# Patient Record
Sex: Female | Born: 1970 | Hispanic: Yes | Marital: Married | Smoking: Never smoker
Health system: Southern US, Community
[De-identification: ages and names within clinical notes are randomized; demographics above are authoritative.]

## PROBLEM LIST (undated history)

## (undated) DIAGNOSIS — E119 Type 2 diabetes mellitus without complications: Secondary | ICD-10-CM

## (undated) DIAGNOSIS — I10 Essential (primary) hypertension: Secondary | ICD-10-CM

---

## 2000-11-11 ENCOUNTER — Encounter: Admission: RE | Admit: 2000-11-11 | Discharge: 2000-11-11 | Payer: Self-pay

## 2003-09-23 ENCOUNTER — Encounter: Admission: RE | Admit: 2003-09-23 | Discharge: 2003-12-22 | Payer: Self-pay | Admitting: Obstetrics and Gynecology

## 2004-02-15 ENCOUNTER — Inpatient Hospital Stay (HOSPITAL_COMMUNITY): Admission: RE | Admit: 2004-02-15 | Discharge: 2004-02-20 | Payer: Self-pay | Admitting: Obstetrics & Gynecology

## 2004-02-22 ENCOUNTER — Inpatient Hospital Stay (HOSPITAL_COMMUNITY): Admission: AD | Admit: 2004-02-22 | Discharge: 2004-02-22 | Payer: Self-pay | Admitting: *Deleted

## 2004-02-23 ENCOUNTER — Inpatient Hospital Stay (HOSPITAL_COMMUNITY): Admission: AD | Admit: 2004-02-23 | Discharge: 2004-02-23 | Payer: Self-pay | Admitting: Obstetrics and Gynecology

## 2004-03-08 ENCOUNTER — Encounter: Admission: RE | Admit: 2004-03-08 | Discharge: 2004-03-08 | Payer: Self-pay | Admitting: Family Medicine

## 2004-03-22 ENCOUNTER — Encounter: Admission: RE | Admit: 2004-03-22 | Discharge: 2004-03-22 | Payer: Self-pay | Admitting: Family Medicine

## 2004-04-18 ENCOUNTER — Encounter: Admission: RE | Admit: 2004-04-18 | Discharge: 2004-04-18 | Payer: Self-pay | Admitting: Family Medicine

## 2004-05-16 ENCOUNTER — Encounter: Admission: RE | Admit: 2004-05-16 | Discharge: 2004-05-16 | Payer: Self-pay | Admitting: Sports Medicine

## 2004-05-23 ENCOUNTER — Encounter: Admission: RE | Admit: 2004-05-23 | Discharge: 2004-05-23 | Payer: Self-pay | Admitting: Family Medicine

## 2004-07-04 ENCOUNTER — Ambulatory Visit: Payer: Self-pay | Admitting: Sports Medicine

## 2004-08-01 ENCOUNTER — Ambulatory Visit: Payer: Self-pay | Admitting: Sports Medicine

## 2004-08-29 ENCOUNTER — Ambulatory Visit: Payer: Self-pay | Admitting: Sports Medicine

## 2004-11-28 ENCOUNTER — Ambulatory Visit: Payer: Self-pay | Admitting: Sports Medicine

## 2004-12-26 ENCOUNTER — Ambulatory Visit: Payer: Self-pay | Admitting: Family Medicine

## 2005-02-13 ENCOUNTER — Ambulatory Visit: Payer: Self-pay | Admitting: Sports Medicine

## 2005-03-16 ENCOUNTER — Ambulatory Visit: Payer: Self-pay | Admitting: Family Medicine

## 2005-06-12 ENCOUNTER — Ambulatory Visit: Payer: Self-pay | Admitting: Sports Medicine

## 2005-06-12 ENCOUNTER — Encounter (INDEPENDENT_AMBULATORY_CARE_PROVIDER_SITE_OTHER): Payer: Self-pay | Admitting: Sports Medicine

## 2005-09-25 ENCOUNTER — Ambulatory Visit: Payer: Self-pay

## 2005-11-06 ENCOUNTER — Ambulatory Visit: Payer: Self-pay | Admitting: Sports Medicine

## 2005-11-23 ENCOUNTER — Ambulatory Visit (HOSPITAL_COMMUNITY): Admission: RE | Admit: 2005-11-23 | Discharge: 2005-11-23 | Payer: Self-pay | Admitting: Sports Medicine

## 2005-12-21 ENCOUNTER — Ambulatory Visit: Payer: Self-pay | Admitting: Sports Medicine

## 2005-12-28 ENCOUNTER — Ambulatory Visit: Payer: Self-pay | Admitting: Family Medicine

## 2006-01-01 ENCOUNTER — Ambulatory Visit: Payer: Self-pay | Admitting: Sports Medicine

## 2006-01-15 ENCOUNTER — Ambulatory Visit: Payer: Self-pay | Admitting: Family Medicine

## 2006-04-25 ENCOUNTER — Ambulatory Visit: Payer: Self-pay | Admitting: Sports Medicine

## 2006-08-08 ENCOUNTER — Encounter (INDEPENDENT_AMBULATORY_CARE_PROVIDER_SITE_OTHER): Payer: Self-pay | Admitting: *Deleted

## 2006-08-20 ENCOUNTER — Ambulatory Visit: Payer: Self-pay | Admitting: Family Medicine

## 2006-09-10 ENCOUNTER — Ambulatory Visit: Payer: Self-pay | Admitting: Sports Medicine

## 2006-11-12 ENCOUNTER — Ambulatory Visit: Payer: Self-pay | Admitting: Family Medicine

## 2006-12-05 DIAGNOSIS — E1169 Type 2 diabetes mellitus with other specified complication: Secondary | ICD-10-CM | POA: Insufficient documentation

## 2006-12-05 DIAGNOSIS — E669 Obesity, unspecified: Secondary | ICD-10-CM

## 2006-12-05 DIAGNOSIS — E1040 Type 1 diabetes mellitus with diabetic neuropathy, unspecified: Secondary | ICD-10-CM | POA: Insufficient documentation

## 2006-12-05 DIAGNOSIS — I10 Essential (primary) hypertension: Secondary | ICD-10-CM

## 2006-12-05 DIAGNOSIS — E1165 Type 2 diabetes mellitus with hyperglycemia: Secondary | ICD-10-CM

## 2006-12-06 ENCOUNTER — Encounter (INDEPENDENT_AMBULATORY_CARE_PROVIDER_SITE_OTHER): Payer: Self-pay | Admitting: *Deleted

## 2007-03-18 ENCOUNTER — Ambulatory Visit: Payer: Self-pay | Admitting: Sports Medicine

## 2007-07-01 ENCOUNTER — Encounter: Payer: Self-pay | Admitting: Family Medicine

## 2007-07-01 ENCOUNTER — Ambulatory Visit: Payer: Self-pay | Admitting: Family Medicine

## 2007-07-01 LAB — CONVERTED CEMR LAB
BUN: 13 mg/dL (ref 6–23)
CO2: 22 meq/L (ref 19–32)
Chloride: 101 meq/L (ref 96–112)
Creatinine, Ser: 0.54 mg/dL (ref 0.40–1.20)
Glucose, Bld: 213 mg/dL — ABNORMAL HIGH (ref 70–99)
HDL: 38 mg/dL — ABNORMAL LOW (ref >39)
Sodium: 137 meq/L (ref 135–145)
Total CHOL/HDL Ratio: 4.3
Triglycerides: 221 mg/dL — ABNORMAL HIGH (ref <150)
VLDL: 44 mg/dL — ABNORMAL HIGH (ref 0–40)

## 2007-07-02 ENCOUNTER — Encounter: Payer: Self-pay | Admitting: Family Medicine

## 2007-07-07 ENCOUNTER — Ambulatory Visit: Payer: Self-pay | Admitting: Family Medicine

## 2007-07-08 ENCOUNTER — Encounter: Payer: Self-pay | Admitting: Family Medicine

## 2007-08-26 ENCOUNTER — Ambulatory Visit: Payer: Self-pay | Admitting: Family Medicine

## 2007-12-17 ENCOUNTER — Ambulatory Visit: Payer: Self-pay | Admitting: Family Medicine

## 2007-12-17 LAB — CONVERTED CEMR LAB
BUN: 12 mg/dL (ref 6–23)
CO2: 22 meq/L (ref 19–32)
Chloride: 100 meq/L (ref 96–112)
Glucose, Bld: 296 mg/dL — ABNORMAL HIGH (ref 70–99)
Potassium: 3.8 meq/L (ref 3.5–5.3)
Sodium: 135 meq/L (ref 135–145)

## 2007-12-19 ENCOUNTER — Encounter: Payer: Self-pay | Admitting: Family Medicine

## 2007-12-19 ENCOUNTER — Telehealth: Payer: Self-pay | Admitting: *Deleted

## 2007-12-22 ENCOUNTER — Encounter: Payer: Self-pay | Admitting: Family Medicine

## 2007-12-29 ENCOUNTER — Ambulatory Visit: Payer: Self-pay | Admitting: Family Medicine

## 2008-01-20 ENCOUNTER — Ambulatory Visit: Payer: Self-pay | Admitting: Family Medicine

## 2008-03-24 ENCOUNTER — Encounter: Payer: Self-pay | Admitting: Family Medicine

## 2008-03-24 ENCOUNTER — Ambulatory Visit: Payer: Self-pay | Admitting: Family Medicine

## 2008-03-24 LAB — CONVERTED CEMR LAB
AST: 19 units/L (ref 0–37)
Albumin: 4.2 g/dL (ref 3.5–5.2)
Creatinine, Ser: 0.53 mg/dL (ref 0.40–1.20)
Sodium: 138 meq/L (ref 135–145)
Total Bilirubin: 0.5 mg/dL (ref 0.3–1.2)
Total Protein: 7.9 g/dL (ref 6.0–8.3)

## 2008-03-25 ENCOUNTER — Encounter: Payer: Self-pay | Admitting: Family Medicine

## 2008-04-05 ENCOUNTER — Encounter: Payer: Self-pay | Admitting: Family Medicine

## 2008-07-07 ENCOUNTER — Ambulatory Visit: Payer: Self-pay | Admitting: Family Medicine

## 2008-07-09 ENCOUNTER — Encounter: Payer: Self-pay | Admitting: Family Medicine

## 2008-10-05 ENCOUNTER — Encounter: Payer: Self-pay | Admitting: Family Medicine

## 2008-11-24 ENCOUNTER — Ambulatory Visit: Payer: Self-pay | Admitting: Family Medicine

## 2008-12-09 ENCOUNTER — Ambulatory Visit: Payer: Self-pay | Admitting: Family Medicine

## 2008-12-23 ENCOUNTER — Ambulatory Visit: Payer: Self-pay | Admitting: Family Medicine

## 2008-12-23 ENCOUNTER — Encounter: Payer: Self-pay | Admitting: Pharmacist

## 2008-12-27 ENCOUNTER — Ambulatory Visit: Payer: Self-pay | Admitting: Family Medicine

## 2008-12-27 ENCOUNTER — Encounter (INDEPENDENT_AMBULATORY_CARE_PROVIDER_SITE_OTHER): Payer: Self-pay | Admitting: Family Medicine

## 2009-01-13 ENCOUNTER — Ambulatory Visit: Payer: Self-pay | Admitting: Family Medicine

## 2009-02-10 ENCOUNTER — Encounter (INDEPENDENT_AMBULATORY_CARE_PROVIDER_SITE_OTHER): Payer: Self-pay | Admitting: Family Medicine

## 2009-02-10 ENCOUNTER — Ambulatory Visit: Payer: Self-pay | Admitting: Family Medicine

## 2009-02-10 ENCOUNTER — Encounter: Payer: Self-pay | Admitting: Family Medicine

## 2009-02-17 ENCOUNTER — Ambulatory Visit: Payer: Self-pay | Admitting: Family Medicine

## 2009-02-22 ENCOUNTER — Ambulatory Visit: Payer: Self-pay | Admitting: Family Medicine

## 2009-02-24 ENCOUNTER — Telehealth: Payer: Self-pay | Admitting: *Deleted

## 2009-03-16 ENCOUNTER — Encounter: Payer: Self-pay | Admitting: Family Medicine

## 2009-03-16 ENCOUNTER — Ambulatory Visit: Payer: Self-pay | Admitting: Family Medicine

## 2009-03-16 LAB — CONVERTED CEMR LAB: GC Probe Amp, Urine: NEGATIVE

## 2009-03-18 ENCOUNTER — Encounter: Payer: Self-pay | Admitting: Family Medicine

## 2009-03-24 ENCOUNTER — Encounter: Payer: Self-pay | Admitting: Family Medicine

## 2009-05-25 ENCOUNTER — Telehealth: Payer: Self-pay | Admitting: Family Medicine

## 2009-05-26 ENCOUNTER — Ambulatory Visit: Payer: Self-pay | Admitting: Family Medicine

## 2009-05-26 LAB — CONVERTED CEMR LAB
Albumin: 3.9 g/dL (ref 3.5–5.2)
Alkaline Phosphatase: 65 units/L (ref 39–117)
CO2: 21 meq/L (ref 19–32)
Chloride: 104 meq/L (ref 96–112)
Creatinine, Ser: 0.53 mg/dL (ref 0.40–1.20)
Glucose, Bld: 166 mg/dL — ABNORMAL HIGH (ref 70–99)
Potassium: 4.1 meq/L (ref 3.5–5.3)
Total Protein: 7.2 g/dL (ref 6.0–8.3)

## 2009-09-07 ENCOUNTER — Ambulatory Visit: Payer: Self-pay | Admitting: Family Medicine

## 2009-09-07 LAB — CONVERTED CEMR LAB
BUN: 11 mg/dL (ref 6–23)
Calcium: 8.9 mg/dL (ref 8.4–10.5)
Chloride: 106 meq/L (ref 96–112)
Glucose, Bld: 181 mg/dL — ABNORMAL HIGH (ref 70–99)
Hgb A1c MFr Bld: 9.2 %
Potassium: 4.2 meq/L (ref 3.5–5.3)
Sodium: 138 meq/L (ref 135–145)

## 2009-12-28 ENCOUNTER — Ambulatory Visit: Payer: Self-pay | Admitting: Family Medicine

## 2009-12-28 LAB — CONVERTED CEMR LAB: Hgb A1c MFr Bld: 9.1 %

## 2010-01-05 ENCOUNTER — Ambulatory Visit: Payer: Self-pay | Admitting: Family Medicine

## 2010-01-11 ENCOUNTER — Encounter (INDEPENDENT_AMBULATORY_CARE_PROVIDER_SITE_OTHER): Payer: Self-pay | Admitting: *Deleted

## 2010-01-11 ENCOUNTER — Telehealth (INDEPENDENT_AMBULATORY_CARE_PROVIDER_SITE_OTHER): Payer: Self-pay | Admitting: Family Medicine

## 2010-05-10 ENCOUNTER — Encounter: Payer: Self-pay | Admitting: *Deleted

## 2010-05-10 ENCOUNTER — Ambulatory Visit: Payer: Self-pay | Admitting: Family Medicine

## 2010-05-10 LAB — CONVERTED CEMR LAB
Albumin: 4 g/dL (ref 3.5–5.2)
CO2: 23 meq/L (ref 19–32)
Chloride: 100 meq/L (ref 96–112)
Glucose, Bld: 280 mg/dL — ABNORMAL HIGH (ref 70–99)
Sodium: 134 meq/L — ABNORMAL LOW (ref 135–145)
TSH: 1.16 microintl units/mL (ref 0.350–4.500)
Total Bilirubin: 0.4 mg/dL (ref 0.3–1.2)
Total Protein: 7.6 g/dL (ref 6.0–8.3)

## 2010-05-11 ENCOUNTER — Encounter: Payer: Self-pay | Admitting: Family Medicine

## 2010-05-12 ENCOUNTER — Encounter: Payer: Self-pay | Admitting: Family Medicine

## 2010-07-28 ENCOUNTER — Encounter: Payer: Self-pay | Admitting: Pharmacist

## 2010-08-09 ENCOUNTER — Other Ambulatory Visit: Admission: RE | Admit: 2010-08-09 | Discharge: 2010-08-09 | Payer: Self-pay | Admitting: Family Medicine

## 2010-08-09 ENCOUNTER — Ambulatory Visit: Payer: Self-pay | Admitting: Family Medicine

## 2010-08-09 LAB — CONVERTED CEMR LAB
Hgb A1c MFr Bld: 12.1 %
Pap Smear: NEGATIVE

## 2010-08-15 ENCOUNTER — Encounter: Payer: Self-pay | Admitting: Family Medicine

## 2010-09-20 ENCOUNTER — Ambulatory Visit: Payer: Self-pay | Admitting: Family Medicine

## 2010-11-09 NOTE — Assessment & Plan Note (Signed)
Summary: f/up,tcb   Vital Signs:  Patient profile:   40 year old female Weight:      165.6 pounds Temp:     98.8 degrees F oral Pulse rate:   79 / minute Pulse rhythm:   regular BP sitting:   109 / 76  (left arm) Cuff size:   regular  Vitals Entered By: Loralee Pacas CMA (May 10, 2010 10:42 AM)  Primary Care Provider:  Denny Levy MD   History of Present Illness: Diabetes follow up with blood sugars in   moderately  good control,   no episodes of low blood sugar. Taking medicines regularly and having no problems with them.  f/u toenail removal--it looks like it is growing back again. Not ingrown at this time. Why is it back?  Hair falling out--last 1 m. Just had big stressor in taht her Father died a month ago.   Current Medications (verified): 1)  Aspirin Childrens 81 Mg Chew (Aspirin) .... Take 1 Tablet By Mouth Once A Day 2)  Zestoretic 20-25 Mg  Tabs (Lisinopril-Hydrochlorothiazide) .Marland Kitchen.. 1 By Mouth Once Daily For Blood Pressure 3)  Metformin Hcl 1000 Mg  Tabs (Metformin Hcl) .Marland Kitchen.. 1  Tab Am and 1 Tab Pm 4)  Micro-K 10 Meq  Cpcr (Potassium Chloride) .... 2 By Mouth Once Daily 5)  Fluconazole 200 Mg  Tabs (Fluconazole) .... Sig 1 By Mouth Today and Repeat X1 in 2 Days For Vaginal Itching 6)  Lantus 100 Unit/ml Soln (Insulin Glargine) .... 30 Units Once Daily At Bedtime - Dispense 1 Month Supply  Allergies: No Known Drug Allergies  Review of Systems  The patient denies anorexia, fever, weight gain, chest pain, syncope, dyspnea on exertion, hemoptysis, abdominal pain, melena, and severe indigestion/heartburn.    Physical Exam  General:  alert, well-developed, and well-nourished.   Neck:  supple, full ROM, no masses, no thyromegaly, and no thyroid nodules or tenderness.   Lungs:  normal respiratory effort.   Heart:  normal rate and regular rhythm.   Abdomen:  soft and non-tender.   Extremities:  LEFT Great toenail is re growing--but no sign of infection.   Impression  & Recommendations:  Problem # 1:  DIABETES MELLITUS II, UNCOMPLICATED (ICD-250.00)  Orders: TSH-[facility] (60454-09811) [facility]- Est  Level 4 (91478)  Problem # 2:  HAIR LOSS (ICD-704.00)  Orders: TSH-[facility] ([id number]) [facility]- Est  Level 4 (57846) likely telogen effluvium will check thyroid  Problem # 3:  INGROWN TOENAIL (ICD-703.0) this is second time this nail has failed to be erradicated. will follow for now  Complete Medication List: 1)  Aspirin Childrens 81 Mg Chew (Aspirin) .... Take 1 tablet by mouth once a day 2)  Zestoretic 20-25 Mg Tabs (Lisinopril-hydrochlorothiazide) .Marland Kitchen.. 1 by mouth once daily for blood pressure 3)  Metformin Hcl 1000 Mg Tabs (Metformin hcl) .Marland Kitchen.. 1  tab am and 1 tab pm 4)  Micro-k 10 Meq Cpcr (Potassium chloride) .... 2 by mouth once daily 5)  Fluconazole 200 Mg Tabs (Fluconazole) .... Sig 1 by mouth today and repeat x1 in 2 days for vaginal itching 6)  Lantus 100 Unit/ml Soln (Insulin glargine) .... 30 units once daily at bedtime - dispense 1 month supply  Other Orders: Comp Met-[facility] ([id number]) A1C-[facility] (24401)  Prevention & Chronic Care Immunizations   Influenza vaccine: Fluvax Non-MCR  (09/07/2009)   Influenza vaccine due: 08/25/2008    Tetanus booster: 02/05/2005: Done.   Tetanus booster due: 02/06/2015    Pneumococcal vaccine: Not documented  Other Screening   Pap smear: NEGATIVE FOR INTRAEPITHELIAL LESIONS OR MALIGNANCY.  (03/16/2009)   Pap smear due: 03/16/2010    Mammogram: Not documented   Smoking status: never  (01/05/2010)  Diabetes Mellitus   HgbA1C: 11.4  (05/10/2010)   HgbA1C action/deferral: Ordered  (09/07/2009)   Hemoglobin A1C due: 12/06/2009    Eye exam: Not documented    Foot exam: yes  (07/07/2007)   High risk foot: Not documented   Foot care education: Not documented   Foot exam due: 07/06/2008    Urine microalbumin/creatinine ratio: Not documented   Urine microalbumin/cr due: 12/16/2008    Diabetes flowsheet  reviewed?: Yes   Progress toward A1C goal: Unchanged  Lipids   Total Cholesterol: 162  (07/01/2007)   Lipid panel action/deferral: LDL Direct Ordered   LDL: 80  (07/01/2007)   LDL Direct: 95  (09/07/2009)   HDL: 38  (07/01/2007)   Triglycerides: 221  (07/01/2007)  Hypertension   Last Blood Pressure: 109 / 76  (05/10/2010)   Serum creatinine: 0.51  (09/07/2009)   BMP action: Ordered   Serum potassium 4.2  (09/07/2009) CMP ordered   Self-Management Support :   Personal Goals (by the next clinic visit) :      Personal blood pressure goal: 130/80  (09/07/2009)     Personal LDL goal: 100  (09/07/2009)    Diabetes self-management support: Pre-printed educational material  (09/07/2009)    Hypertension self-management support: Not documented    Hypertension self-management support not done because: Good outcomes  (09/07/2009)  Laboratory Results   Blood Tests   Date/Time Received: May 10, 2010 11:22 AM  Date/Time Reported: May 10, 2010 11:40 AM   HGBA1C: 11.4%   (Normal Range: Non-Diabetic - 3-6%   Control Diabetic - 6-8%)  Comments: ...........test performed by...........Marland KitchenTerese Door, CMA

## 2010-11-09 NOTE — Letter (Signed)
Summary: Out of Work  [facility]  [address]   [place], [place] [address]   Phone: [phone]  Fax: [phone]    January 11, 2010   Employee:  Aura Dials    To Whom It May Concern:   For Medical reasons, please excuse the above named employee from work for the following dates:  Start:   January 13, 2010  End:   January 17, 2010  If you need additional information, please feel free to contact our office.         Sincerely,    Gladstone Pih

## 2010-11-09 NOTE — Letter (Signed)
Summary: Out of Work  [facility]  [address]   [place], [place] [address]   Phone: [phone]  Fax: [phone]    January 05, 2010   Employee:  Aura Dials    To Whom It May Concern:   For Medical reasons, please excuse the above named employee from work for the following dates:  Start:   01/05/2010  End:   01/13/2010  If you need additional information, please feel free to contact our office.         Sincerely,    Denny Levy MD

## 2010-11-09 NOTE — Letter (Signed)
Summary: Work Excuse  [facility]  [address]   [place], [place] [address]   Phone: [phone]  Fax: [phone]    Today's Date: May 10, 2010  Name of Patient: Rebecca Terrell  The above named patient had a medical visit today at: 10 am.  Please take this into consideration when reviewing the time away from work/school.    Special Instructions:  [ x None  [  ] To be off the remainder of today, returning to the normal work / school schedule tomorrow.  [  ] To be off until the next scheduled appointment on ______________________.  [  ] Other ________________________________________________________________ ________________________________________________________________________   Sincerely yours,   Loralee Pacas CMA

## 2010-11-09 NOTE — Letter (Signed)
Summary: Generic Letter  [facility]  [address]   [place], [place] [address]   Phone: [phone]  Fax: [phone]    05/11/2010  [place] [address] [place], [place]  [address]  Dear [name],  Your A1C jumped WAY back up---to 11.4.   You had been doing much better.  Please schedule an appointment in [facility]Y [facility] to see Dr Raymondo Band as soon as you can get in. I think we need to make some adjustments pretty quickly.  I will  still plan on seeing you back in 3 months unless Dr Raymondo Band thinks I need to see you soone. Let me know if you have any problem scheduling to see him.  The rest of your blood work including your thyroid test looked OK.        Sincerely,   Denny Levy MD   Appended Document: Generic Letter mailed

## 2010-11-09 NOTE — Assessment & Plan Note (Signed)
Summary: CPE/KH   Vital Signs:  Patient profile:   40 year old female LMP:     07/29/2010 Weight:      164 pounds Temp:     98.4 degrees F oral Pulse rate:   67 / minute Pulse rhythm:   regular BP sitting:   115 / 83  (right arm) Cuff size:   regular  Vitals Entered By: Loralee Pacas CMA (August 09, 2010 10:26 AM) CC: cpe Is Patient Diabetic? Yes Did you bring your meter with you today? No LMP (date): 07/29/2010     Enter LMP: 07/29/2010 Last PAP Result NEGATIVE FOR INTRAEPITHELIAL LESIONS OR MALIGNANCY.   Primary Care Provider:  Denny Levy MD  CC:  cpe.  History of Present Illness: pap smear and dm f/u. Here with her own interpretor Elvera Maria. 1) dm--has missed her insulin on average 1-2 days a week--no specific reason. Has eough medicine and no problems with it.  Diet is same. No regular exercise except work. No low blood sugars, no increased thirst or increased urination.  2) wants pap smear to update her preventive needs. Periods regular. No problems.  Habits & Providers  Alcohol-Tobacco-Diet     Tobacco Status: never     Passive Smoke Exposure: no  Exercise-Depression-Behavior     Have you felt down or hopeless? yes     Have you felt little pleasure in things? no     Depression Counseling: further diagnostic testing and/or other treatment is indicated     Seat Belt Use: always  Current Medications (verified): 1)  Aspirin Childrens 81 Mg Chew (Aspirin) .... Take 1 Tablet By Mouth Once A Day 2)  Zestoretic 20-25 Mg  Tabs (Lisinopril-Hydrochlorothiazide) .Marland Kitchen.. 1 By Mouth Once Daily For Blood Pressure 3)  Metformin Hcl 1000 Mg  Tabs (Metformin Hcl) .Marland Kitchen.. 1  Tab Am and 1 Tab Pm 4)  Micro-K 10 Meq  Cpcr (Potassium Chloride) .... 2 By Mouth Once Daily 5)  Fluconazole 200 Mg  Tabs (Fluconazole) .... Sig 1 By Mouth Today and Repeat X1 in 2 Days For Vaginal Itching 6)  Lantus 100 Unit/ml Soln (Insulin Glargine) .... 30 Units Once Daily At Bedtime - Dispense 1  Month Supply 7)  Calcium Carbonate 600 Mg Tabs (Calcium Carbonate) .Marland Kitchen.. 1po Qd  Allergies: No Known Drug Allergies  Social History: Risk analyst Use:  always  Review of Systems  The patient denies anorexia, fever, weight loss, weight gain, vision loss, chest pain, syncope, peripheral edema, prolonged cough, headaches, and abdominal pain.    Physical Exam  General:  alert, well-developed, well-nourished, well-hydrated, and overweight-appearing.   Eyes:  vision grossly intact, pupils equal, and pupils round.   Ears:  R ear normal and L ear normal.   Nose:  no external deformity.   Mouth:  pharynx pink and moist.   Neck:  supple, full ROM, and no masses.   Breasts:  skin/areolae normal, no masses, no abnormal thickening, no nipple discharge, no tenderness, and no adenopathy.  she has some changes of fibrocystic disease but no worrisome masses. Lungs:  normal respiratory effort and normal breath sounds.   Heart:  normal rate, regular rhythm, and no murmur.   Abdomen:  soft and non-tender.   Genitalia:  normal introitus, no external lesions, no vaginal discharge, mucosa pink and moist, no vaginal or cervical lesions, no vaginal atrophy, normal uterus size and position, and no adnexal masses or tenderness.   Msk:  normal ROM, no joint tenderness, and no joint  warmth.   Pulses:  2+ B = radial and DP Extremities:  No clubbing, cyanosis, edema, or deformity noted with normal full range of motion of all joints.   Neurologic:  alert & oriented X3, strength normal in all extremities, and gait normal.   Skin:  turgor normal, color normal, no rashes, and no suspicious lesions.   Psych:  Oriented X3, memory intact for recent and remote, and normally interactive.  Baseline affect of slightly flat.   Impression & Recommendations:  Problem # 1:  DIABETES MELLITUS II, UNCOMPLICATED (ICD-250.00)  Her updated medication list for this problem includes:    Aspirin Childrens 81 Mg Chew (Aspirin) .Marland Kitchen...  Take 1 tablet by mouth once a day    Zestoretic 20-25 Mg Tabs (Lisinopril-hydrochlorothiazide) .Marland Kitchen... 1 by mouth once daily for blood pressure    Metformin Hcl 1000 Mg Tabs (Metformin hcl) .Marland Kitchen... 1  tab am and 1 tab pm    Lantus 100 Unit/ml Soln (Insulin glargine) .Marland KitchenMarland KitchenMarland KitchenMarland Kitchen 30 units once daily at bedtime - dispense 1 month supply  Orders: A1C-[facility] (62952) [facility]- Est  Level 4 (84132) Long discussion, reviewed her A1C readings for last years. Discussed risks of her current lack of glucose control. > 50% ov 40 minutes spent in counseling. Urged to take insulin regularly, discussed diet rtc 3 m  Problem # 2:  PREVENTIVE HEALTH CARE (ICD-V70.0) pap and breast exam today  Complete Medication List: 1)  Aspirin Childrens 81 Mg Chew (Aspirin) .... Take 1 tablet by mouth once a day 2)  Zestoretic 20-25 Mg Tabs (Lisinopril-hydrochlorothiazide) .Marland Kitchen.. 1 by mouth once daily for blood pressure 3)  Metformin Hcl 1000 Mg Tabs (Metformin hcl) .Marland Kitchen.. 1  tab am and 1 tab pm 4)  Micro-k 10 Meq Cpcr (Potassium chloride) .... 2 by mouth once daily 5)  Fluconazole 200 Mg Tabs (Fluconazole) .... Sig 1 by mouth today and repeat x1 in 2 days for vaginal itching 6)  Lantus 100 Unit/ml Soln (Insulin glargine) .... 30 units once daily at bedtime - dispense 1 month supply 7)  Calcium Carbonate 600 Mg Tabs (Calcium carbonate) .Marland Kitchen.. 1po qd  Other Orders: Influenza Vaccine NON MCR (44010) Pap Smear-[facility] ([id number])   Orders Added: 1)  A1C-[facility] [83036] 2)  Influenza Vaccine NON MCR [00028] 3)  Pap Smear-[facility] [[id number]] 4)  [facility]- Est  Level 4 [87564]   Immunizations Administered:  Influenza Vaccine # 1:    Vaccine Type: Fluvax Non-MCR    Site: left deltoid    Mfr: GlaxoSmithKline    Dose: 0.5 ml    Route: IM    Given by: Loralee Pacas CMA    Exp. Date: 04/04/2011    Lot #: [id number]    VIS given: 05/02/10 version given August 09, 2010.  Flu Vaccine Consent Questions:    Do you have a history of severe allergic  reactions to this vaccine? no    Any prior history of allergic reactions to egg and/or gelatin? no    Do you have a sensitivity to the preservative Thimersol? no    Do you have a past history of Guillan-Barre Syndrome? no    Do you currently have an acute febrile illness? no    Have you ever had a severe reaction to latex? no    Vaccine information given and explained to patient? yes    Are you currently pregnant? no   Immunizations Administered:  Influenza Vaccine # 1:    Vaccine Type: Fluvax Non-MCR    Site: left deltoid  Mfr: GlaxoSmithKline    Dose: 0.5 ml    Route: IM    Given by: Loralee Pacas CMA    Exp. Date: 04/04/2011    Lot #: [id number]    VIS given: 05/02/10 version given August 09, 2010.  Laboratory Results   Blood Tests   Date/Time Received: August 09, 2010 10:35 AM  Date/Time Reported: August 09, 2010 11:14 AM   HGBA1C: 12.1%   (Normal Range: Non-Diabetic - 3-6%   Control Diabetic - 6-8%)  Comments: ...............test performed by......Marland KitchenBonnie A. [place], MLS (ASCP)cm

## 2010-11-09 NOTE — Assessment & Plan Note (Signed)
Summary: toenail removal,df   Vital Signs:  Patient profile:   40 year old female Height:      62 inches Weight:      171.0 pounds BMI:     31.39 Temp:     98.4 degrees F oral Pulse rate:   81 / minute BP sitting:   145 / 94  (left arm) Cuff size:   regular  Vitals Entered By: Gladstone Pih (January 05, 2010 10:44 AM) CC: toe nail removal Is Patient Diabetic? Yes Did you bring your meter with you today? No Pain Assessment Patient in pain? no        Primary Care Provider:  Denny Levy MD  CC:  toe nail removal.  History of Present Illness: f/u infected ingrown  great toenail completed antibiotics  Habits & Providers  Alcohol-Tobacco-Diet     Tobacco Status: never  Allergies: No Known Drug Allergies  Physical Exam  Msk:  left great toe no sign infection. medial border nail ingrowth Additional Exam:  Informed consent given and signed copy in chart. Appropriate time out taken. Area prepperd and draped in usual sterile fashion. Rubber band used as tourniquet and --6-- cc of l2% idocaine used in digital block for local anasthesia. Toenail elevated with nail elevator, removed by traction in entirety.The nail bed curetted and phenol application followed by copious alcohol wash  completed. Tourniquet removed. Hemostasis maintained well. topical antibiotic and bulky dresing applied.       Impression & Recommendations:  Problem # 1:  INGROWN NAIL, INFECTED (ICD-703.0)  Her updated medication list for this problem includes:    Keflex 500 Mg Caps (Cephalexin) .Marland Kitchen... 1 by mouth bid  Orders: Provider Misc Charge- [facility] (Misc)  Complete Medication List: 1)  Aspirin Childrens 81 Mg Chew (Aspirin) .... Take 1 tablet by mouth once a day 2)  Zestoretic 20-25 Mg Tabs (Lisinopril-hydrochlorothiazide) .Marland Kitchen.. 1 by mouth once daily for blood pressure 3)  Metformin Hcl 1000 Mg Tabs (Metformin hcl) .Marland Kitchen.. 1  tab am and 1 tab pm 4)  Micro-k 10 Meq Cpcr (Potassium chloride) .... 2 by mouth  once daily 5)  Fluconazole 200 Mg Tabs (Fluconazole) .... Sig 1 by mouth today and repeat x1 in 2 days for vaginal itching 6)  Lantus 100 Unit/ml Soln (Insulin glargine) .... 30 units once daily at bedtime - dispense 1 month supply 7)  Hydrocodone-acetaminophen 5-500 Mg Tabs (Hydrocodone-acetaminophen) .Marland Kitchen.. 1 tab by mouth q 4-6 hrs as needed pain 8)  Keflex 500 Mg Caps (Cephalexin) .Marland Kitchen.. 1 by mouth bid Prescriptions: HYDROCODONE-ACETAMINOPHEN 5-500 MG TABS (HYDROCODONE-ACETAMINOPHEN) 1 tab by mouth q 4-6 hrs as needed pain  #20 x 0   Entered and Authorized by:   Denny Levy MD   Signed by:   Denny Levy MD on 01/05/2010   Method used:   Print then Give to Patient   RxID:   [id number]

## 2010-11-09 NOTE — Letter (Signed)
Summary: PAP Letter  [facility]  [address]   [place], [place] [address]   Phone: [phone]  Fax: [phone]    08/15/2010  Rebecca Terrell [address] [place], [place]  [address]  Dear [name],     Your pap smear showed no sign of cervical cancer. Your next pap should be in 3 years.       Sincerely,   Denny Levy MD  Appended Document: PAP Letter mailed

## 2010-11-09 NOTE — Miscellaneous (Signed)
Summary: Orders Update  Clinical Lists Changes  Problems: Added new problem of ENCOUNTER FOR LONG-TERM USE OF OTHER MEDICATIONS (ICD-V58.69) Orders: Added new Test order of B12-[facility] [phone]) - Signed Added new Test order of CBC-[facility] (75643) - Signed  Per Dr. Jennette Kettle

## 2010-11-09 NOTE — Miscellaneous (Signed)
  Clinical Lists Changes  Medications: Added new medication of CALCIUM CARBONATE 600 MG TABS (CALCIUM CARBONATE) 1po qd - Signed Rx of CALCIUM CARBONATE 600 MG TABS (CALCIUM CARBONATE) 1po qd;  #90 x 3;  Signed;  Entered by: Denny Levy MD;  Authorized by: Denny Levy MD;  Method used: Printed then faxed to    Prescriptions: CALCIUM CARBONATE 600 MG TABS (CALCIUM CARBONATE) 1po qd  #90 x 3   Entered and Authorized by:   Denny Levy MD   Signed by:   Denny Levy MD on 05/12/2010   Method used:   Printed then faxed to ...         RxID:   [id number]  faxed to [facility]

## 2010-11-09 NOTE — Progress Notes (Signed)
Summary: Letter Needed  Phone Note Call from Patient Call back at [phone]   Caller: Delia-Friend Summary of Call: Pt needs another note to stay out of work for another week.  Dr. Jennette Kettle told her to call if she needed one. Fax it to Att: Channing Mutters [phone]. Initial call taken by: Clydell Hakim,  January 11, 2010 12:24 PM  Follow-up for Phone Call        letter faxed Follow-up by: Gladstone Pih,  January 11, 2010 3:52 PM

## 2010-11-09 NOTE — Letter (Signed)
Summary: Out of Work  [facility]  [address]   [place], [place] [address]   Phone: [phone]  Fax: [phone]    September 20, 2010   Employee:  Rebecca Terrell    To Whom It May Concern:   For Medical reasons, please excuse the above named employee from work for the following dates:  Late for work today due to AM doctor's appointment.    If you need additional information, please feel free to contact our office.         Sincerely,    Denny Levy MD

## 2010-11-09 NOTE — Assessment & Plan Note (Signed)
Summary: f/up,tcb   Vital Signs:  Patient profile:   40 year old female Height:      62 inches Weight:      173.2 pounds BMI:     31.79 Temp:     98.5 degrees F oral Pulse rate:   76 / minute BP sitting:   134 / 88  (left arm) Cuff size:   regular  Vitals Entered By: Gladstone Pih (December 28, 2009 11:06 AM) CC: F/U Is Patient Diabetic? Yes Did you bring your meter with you today? No Pain Assessment Patient in pain? no      Comments Left big toe nail grew part and causing her problems   Primary Care Provider:  Denny Levy MD  CC:  F/U.  History of Present Illness: f/u DM: has really worked hard to watch her diet. Sad to learn her A1C is virtually unchanged. She has continued on 12 u lantus subcutaneously and heer oral meds  also having retrn of left great tioe ingrown nail. Sore and looks infected. had removal over one year ago here at [facility]  Habits & Providers  Alcohol-Tobacco-Diet     Tobacco Status: never  Current Medications (verified): 1)  Aspirin Childrens 81 Mg Chew (Aspirin) .... Take 1 Tablet By Mouth Once A Day 2)  Zestoretic 20-25 Mg  Tabs (Lisinopril-Hydrochlorothiazide) .Marland Kitchen.. 1 By Mouth Once Daily For Blood Pressure 3)  Metformin Hcl 1000 Mg  Tabs (Metformin Hcl) .Marland Kitchen.. 1  Tab Am and 1 Tab Pm 4)  Micro-K 10 Meq  Cpcr (Potassium Chloride) .... 2 By Mouth Once Daily 5)  Fluconazole 200 Mg  Tabs (Fluconazole) .... Sig 1 By Mouth Today and Repeat X1 in 2 Days For Vaginal Itching 6)  Lantus 100 Unit/ml Soln (Insulin Glargine) .... 30 Units Once Daily At Bedtime - Dispense 1 Month Supply 7)  Hydrocodone-Acetaminophen 5-500 Mg Tabs (Hydrocodone-Acetaminophen) .Marland Kitchen.. 1 Tab By Mouth Q 4-6 Hrs As Needed Pain 8)  Keflex 500 Mg Caps (Cephalexin) .Marland Kitchen.. 1 By Mouth Bid  Allergies: No Known Drug Allergies  Review of Systems  The patient denies anorexia, fever, weight loss, weight gain, vision loss, chest pain, syncope, dyspnea on exertion, peripheral edema, headaches, and  abdominal pain.    Physical Exam  General:  alert and well-developed.   Neck:  supple, full ROM, no masses, no thyromegaly, and no carotid bruits.   Lungs:  normal respiratory effort and normal breath sounds.   Heart:  normal rate, regular rhythm, and no murmur.   Extremities:  L great toe has evicence of mild cellulitis at medial border of toenail   Impression & Recommendations:  Problem # 1:  DIABETES MELLITUS II, UNCOMPLICATED (ICD-250.00) Assessment Unchanged Orders: A1C-[facility] (19147) [facility]- Est  Level 4 (99214)we will increase her lantus. I removed the sulfonylura as I do not think it is doing anything and may make her more prone top hypoglycemic episodes. wlill repeat A1C 3 months. I suspect we are not yet at her appropriate dose but she wishes to proceed slowly in her titration up of her insuliin  Problem # 2:  INGROWN NAIL, INFECTED (ICD-703.0)  Her updated medication list for this problem includes:    Keflex 500 Mg Caps (Cephalexin) .Marland Kitchen... 1 by mouth bid reviewed the procedure note--it looks like they did  a nail bed ablation and it looks like they tried to reove whole nail---obviosuy this did not work. Will tx w abx--she will call in 48 hours--if not improving we will schedule repeat removal. this  is first time in a year that she has had problems so she wanted to try conservative (abx) tx first and I agree. She will let me know next week how itis doing and if we need to schedule remova, Orders: [facility]- Est  Level 4 (81191)  Complete Medication List: 1)  Aspirin Childrens 81 Mg Chew (Aspirin) .... Take 1 tablet by mouth once a day 2)  Zestoretic 20-25 Mg Tabs (Lisinopril-hydrochlorothiazide) .Marland Kitchen.. 1 by mouth once daily for blood pressure 3)  Metformin Hcl 1000 Mg Tabs (Metformin hcl) .Marland Kitchen.. 1  tab am and 1 tab pm 4)  Micro-k 10 Meq Cpcr (Potassium chloride) .... 2 by mouth once daily 5)  Fluconazole 200 Mg Tabs (Fluconazole) .... Sig 1 by mouth today and repeat x1 in 2 days for vaginal  itching 6)  Lantus 100 Unit/ml Soln (Insulin glargine) .... 30 units once daily at bedtime - dispense 1 month supply 7)  Hydrocodone-acetaminophen 5-500 Mg Tabs (Hydrocodone-acetaminophen) .Marland Kitchen.. 1 tab by mouth q 4-6 hrs as needed pain 8)  Keflex 500 Mg Caps (Cephalexin) .Marland Kitchen.. 1 by mouth bid A1C-[facility] (47829) [facility]- Est  Level 4 (56213)  Patient Instructions: 1)  STOp the glucotrol. Increase your lantus to 30 unites at night. Watch your diet. Let me see you in 3 months. Call if you are having any problems. Prescriptions: FLUCONAZOLE 200 MG  TABS (FLUCONAZOLE) sig 1 by mouth today and repeat x1 in 2 days for vaginal itching  #2 x 5   Entered and Authorized by:   Denny Levy MD   Signed by:   Denny Levy MD on 12/28/2009   Method used:   Printed then faxed to ...         RxID:   [id number] MICRO-K 10 MEQ  CPCR (POTASSIUM CHLORIDE) 2 by mouth once daily  #180 x 3   Entered and Authorized by:   Denny Levy MD   Signed by:   Denny Levy MD on 12/28/2009   Method used:   Printed then faxed to ...         RxID:   [id number] METFORMIN HCL 1000 MG  TABS (METFORMIN HCL) 1  tab am and 1 tab pm  #180 x 3   Entered and Authorized by:   Denny Levy MD   Signed by:   Denny Levy MD on 12/28/2009   Method used:   Printed then faxed to ...         RxID:   [id number] ZESTORETIC 20-25 MG  TABS (LISINOPRIL-HYDROCHLOROTHIAZIDE) 1 by mouth once daily for blood pressure  #90 x 3   Entered and Authorized by:   Denny Levy MD   Signed by:   Denny Levy MD on 12/28/2009   Method used:   Printed then faxed to ...         RxID:   [id number] KEFLEX 500 MG CAPS (CEPHALEXIN) 1 by mouth bid  #20 x 1   Entered and Authorized by:   Denny Levy MD   Signed by:   Denny Levy MD on 12/28/2009   Method used:   Print then Give to Patient   RxID:   [id number] LANTUS 100 UNIT/ML SOLN (INSULIN GLARGINE) 30 units once daily at bedtime - dispense 1 month supply  #67month x 3   Entered and Authorized by:   Denny Levy  MD   Signed by:   Denny Levy MD on 12/28/2009   Method used:   Print then Give to Patient   RxID:  [id number]    Laboratory Results   Blood Tests   Date/Time Received: December 28, 2009 11:14 AM  Date/Time Reported: December 28, 2009 11:34 AM   HGBA1C: 9.1%   (Normal Range: Non-Diabetic - 3-6%   Control Diabetic - 6-8%)  Comments: ...........test performed by...........Marland KitchenTerese Door, CMA     Prevention & Chronic Care Immunizations   Influenza vaccine: Fluvax Non-MCR  (09/07/2009)   Influenza vaccine due: 08/25/2008    Tetanus booster: 02/05/2005: Done.   Tetanus booster due: 02/06/2015    Pneumococcal vaccine: Not documented  Other Screening   Pap smear: NEGATIVE FOR INTRAEPITHELIAL LESIONS OR MALIGNANCY.  (03/16/2009)   Pap smear due: 03/16/2010   Smoking status: never  (12/28/2009)  Diabetes Mellitus   HgbA1C: 9.1  (12/28/2009)   HgbA1C action/deferral: Ordered  (09/07/2009)   Hemoglobin A1C due: 12/06/2009    Eye exam: Not documented    Foot exam: yes  (07/07/2007)   High risk foot: Not documented   Foot care education: Not documented   Foot exam due: 07/06/2008    Urine microalbumin/creatinine ratio: Not documented   Urine microalbumin/cr due: 12/16/2008    Diabetes flowsheet reviewed?: Yes   Progress toward A1C goal: Unchanged  Lipids   Total Cholesterol: 162  (07/01/2007)   Lipid panel action/deferral: LDL Direct Ordered   LDL: 80  (07/01/2007)   LDL Direct: 95  (09/07/2009)   HDL: 38  (07/01/2007)   Triglycerides: 221  (07/01/2007)  Hypertension   Last Blood Pressure: 134 / 88  (12/28/2009)   Serum creatinine: 0.51  (09/07/2009)   BMP action: Ordered   Serum potassium 4.2  (09/07/2009)  Self-Management Support :   Personal Goals (by the next clinic visit) :      Personal blood pressure goal: 130/80  (09/07/2009)     Personal LDL goal: 100  (09/07/2009)    Diabetes self-management support: Pre-printed educational material   (09/07/2009)    Hypertension self-management support: Not documented    Hypertension self-management support not done because: Good outcomes  (09/07/2009)    Impression & Recommendations:  The following medications were removed from the medication list:    Glucotrol 10 Mg Tabs (Glipizide) .Marland Kitchen... Take 1 tablet by mouth twice a day  Her updated medication list for this problem includes:    Aspirin Childrens 81 Mg Chew (Aspirin) .Marland Kitchen... Take 1 tablet by mouth once a day    Zestoretic 20-25 Mg Tabs (Lisinopril-hydrochlorothiazide) .Marland Kitchen... 1 by mouth once daily for blood pressure    Metformin Hcl 1000 Mg Tabs (Metformin hcl) .Marland Kitchen... 1  tab am and 1 tab pm    Lantus 100 Unit/ml Soln (Insulin glargine) .Marland KitchenMarland KitchenMarland KitchenMarland Kitchen 30 units once daily at bedtime - dispense 1 month supply Orders: A1C-[facility] (84696)  Orders: A1C-[facility] (29528) [facility]- Est  Level 4 (41324)    Her updated medication list for this problem includes:    Keflex 500 Mg Caps (Cephalexin) .Marland Kitchen... 1 by mouth bid   Orders: [facility]- Est  Level 4 (40102)    Complete Medication List: 1)  Aspirin Childrens 81 Mg Chew (Aspirin) .... Take 1 tablet by mouth once a day 2)  Zestoretic 20-25 Mg Tabs (Lisinopril-hydrochlorothiazide) .Marland Kitchen.. 1 by mouth once daily for blood pressure 3)  Metformin Hcl 1000 Mg Tabs (Metformin hcl) .Marland Kitchen.. 1  tab am and 1 tab pm 4)  Micro-k 10 Meq Cpcr (Potassium chloride) .... 2 by mouth once daily 5)  Fluconazole 200 Mg Tabs (Fluconazole) .... Sig 1 by mouth today and repeat x1 in  2 days for vaginal itching 6)  Lantus 100 Unit/ml Soln (Insulin glargine) .... 30 units once daily at bedtime - dispense 1 month supply 7)  Hydrocodone-acetaminophen 5-500 Mg Tabs (Hydrocodone-acetaminophen) .Marland Kitchen.. 1 tab by mouth q 4-6 hrs as needed pain 8)  Keflex 500 Mg Caps (Cephalexin) .Marland Kitchen.. 1 by mouth bid

## 2010-11-09 NOTE — Assessment & Plan Note (Signed)
Summary: F/U  KH   Vital Signs:  Patient profile:   40 year old female Height:      62 inches Weight:      166.4 pounds BMI:     30.54 Temp:     98.3 degrees F oral Pulse rate:   68 / minute BP sitting:   109 / 79  (left arm) Cuff size:   large  Vitals Entered By: Jimmy Footman, CMA (September 20, 2010 8:45 AM) CC: follow up Is Patient Diabetic? Yes Did you bring your meter with you today? Yes Pain Assessment Patient in pain? no        Primary Care Provider:  Denny Levy MD  CC:  follow up.  History of Present Illness: Diabetes follow up with blood sugars in pretty   good control,   no episodes of low blood sugar. Taking medicines regularly and having no problems with them.   Habits & Providers  Alcohol-Tobacco-Diet     Tobacco Status: never  Current Medications (verified): 1)  Aspirin Childrens 81 Mg Chew (Aspirin) .... Take 1 Tablet By Mouth Once A Day 2)  Zestoretic 20-25 Mg  Tabs (Lisinopril-Hydrochlorothiazide) .Marland Kitchen.. 1 By Mouth Once Daily For Blood Pressure 3)  Metformin Hcl 1000 Mg  Tabs (Metformin Hcl) .Marland Kitchen.. 1  Tab Am and 1 Tab Pm 4)  Micro-K 10 Meq  Cpcr (Potassium Chloride) .... 2 By Mouth Once Daily 5)  Fluconazole 200 Mg  Tabs (Fluconazole) .... Sig 1 By Mouth Today and Repeat X1 in 2 Days For Vaginal Itching 6)  Lantus 100 Unit/ml Soln (Insulin Glargine) .... 30 Units Once Daily At Bedtime - Dispense 1 Month Supply 7)  Calcium Carbonate 600 Mg Tabs (Calcium Carbonate) .Marland Kitchen.. 1po Qd  Allergies: No Known Drug Allergies  Review of Systems  The patient denies fever, weight loss, and weight gain.    Physical Exam  General:  alert, well-developed, well-nourished, and well-hydrated.     Impression & Recommendations:  Problem # 1:  DIABETES MELLITUS II, UNCOMPLICATED (ICD-250.00) Orders: A1C-[facility] (13086) [facility]- Est Level  3 (99213)much improved we discussed rtc 6 w  Complete Medication List: 1)  Aspirin Childrens 81 Mg Chew (Aspirin) .... Take 1 tablet by  mouth once a day 2)  Zestoretic 20-25 Mg Tabs (Lisinopril-hydrochlorothiazide) .Marland Kitchen.. 1 by mouth once daily for blood pressure 3)  Metformin Hcl 1000 Mg Tabs (Metformin hcl) .Marland Kitchen.. 1  tab am and 1 tab pm 4)  Micro-k 10 Meq Cpcr (Potassium chloride) .... 2 by mouth once daily 5)  Fluconazole 200 Mg Tabs (Fluconazole) .... Sig 1 by mouth today and repeat x1 in 2 days for vaginal itching 6)  Lantus 100 Unit/ml Soln (Insulin glargine) .... 30 units once daily at bedtime - dispense 1 month supply 7)  Calcium Carbonate 600 Mg Tabs (Calcium carbonate) .Marland Kitchen.. 1po qd A1C-[facility] (57846) [facility]- Est Level  3 (96295) Prescriptions: CALCIUM CARBONATE 600 MG TABS (CALCIUM CARBONATE) 1po qd  #90 x 3   Entered and Authorized by:   Denny Levy MD   Signed by:   Denny Levy MD on 09/20/2010   Method used:   Printed then faxed to ...         RxID:   [id number] LANTUS 100 UNIT/ML SOLN (INSULIN GLARGINE) 30 units once daily at bedtime - dispense 1 month supply  #90month x 12   Entered and Authorized by:   Denny Levy MD   Signed by:   Denny Levy MD on 09/20/2010   Method  used:   Printed then faxed to ...         RxID:   [id number] FLUCONAZOLE 200 MG  TABS (FLUCONAZOLE) sig 1 by mouth today and repeat x1 in 2 days for vaginal itching  #2 x 5   Entered and Authorized by:   Denny Levy MD   Signed by:   Denny Levy MD on 09/20/2010   Method used:   Printed then faxed to ...         RxID:   [id number] MICRO-K 10 MEQ  CPCR (POTASSIUM CHLORIDE) 2 by mouth once daily  #180 x 3   Entered and Authorized by:   Denny Levy MD   Signed by:   Denny Levy MD on 09/20/2010   Method used:   Printed then faxed to ...         RxID:   [id number] METFORMIN HCL 1000 MG  TABS (METFORMIN HCL) 1  tab am and 1 tab pm  #180 x 3   Entered and Authorized by:   Denny Levy MD   Signed by:   Denny Levy MD on 09/20/2010   Method used:   Printed then faxed to ...         RxID:   [id number] ZESTORETIC 20-25 MG  TABS  (LISINOPRIL-HYDROCHLOROTHIAZIDE) 1 by mouth once daily for blood pressure  #90 x 3   Entered and Authorized by:   Denny Levy MD   Signed by:   Denny Levy MD on 09/20/2010   Method used:   Printed then faxed to ...         RxID:   [id number]    Orders Added: 1)  A1C-[facility] [83036] 2)  [facility]- Est Level  3 [99213]    Laboratory Results   Blood Tests   Date/Time Received: September 20, 2010 8:54 AM  Date/Time Reported: September 20, 2010 9:49 AM   HGBA1C: 10.1%   (Normal Range: Non-Diabetic - 3-6%   Control Diabetic - 6-8%)  Comments: ...............test performed by......Marland KitchenBonnie A. [place], MLS (ASCP)cm     Prevention & Chronic Care Immunizations   Influenza vaccine: Fluvax Non-MCR  (08/09/2010)   Influenza vaccine due: 08/25/2008    Tetanus booster: 02/05/2005: Done.   Tetanus booster due: 02/06/2015    Pneumococcal vaccine: Not documented  Other Screening   Pap smear: NEGATIVE FOR INTRAEPITHELIAL LESIONS OR MALIGNANCY.  (08/09/2010)   Pap smear due: 03/16/2010    Mammogram: Not documented   Smoking status: never  (09/20/2010)  Diabetes Mellitus   HgbA1C: 10.1  (09/20/2010)   HgbA1C action/deferral: Ordered  (09/07/2009)   Hemoglobin A1C due: 12/06/2009    Eye exam: Not documented    Foot exam: yes  (07/07/2007)   High risk foot: Not documented   Foot care education: Not documented   Foot exam due: 07/06/2008    Urine microalbumin/creatinine ratio: Not documented   Urine microalbumin/cr due: 12/16/2008    Diabetes flowsheet reviewed?: Yes   Progress toward A1C goal: Improved  Lipids   Total Cholesterol: 162  (07/01/2007)   Lipid panel action/deferral: LDL Direct Ordered   LDL: 80  (07/01/2007)   LDL Direct: 95  (09/07/2009)   HDL: 38  (07/01/2007)   Triglycerides: 221  (07/01/2007)  Hypertension   Last Blood Pressure: 109 / 79  (09/20/2010)   Serum creatinine: 0.61  (05/10/2010)   BMP action: Ordered   Serum potassium 3.8   (05/10/2010)  Self-Management Support :   Personal Goals (by the next clinic visit) :  Personal blood pressure goal: 130/80  (09/07/2009)     Personal LDL goal: 100  (09/07/2009)    Diabetes self-management support: Pre-printed educational material  (09/07/2009)    Hypertension self-management support: Not documented    Hypertension self-management support not done because: Good outcomes  (09/07/2009)    Impression & Recommendations:  Her updated medication list for this problem includes:    Aspirin Childrens 81 Mg Chew (Aspirin) .Marland Kitchen... Take 1 tablet by mouth once a day    Zestoretic 20-25 Mg Tabs (Lisinopril-hydrochlorothiazide) .Marland Kitchen... 1 by mouth once daily for blood pressure    Metformin Hcl 1000 Mg Tabs (Metformin hcl) .Marland Kitchen... 1  tab am and 1 tab pm    Lantus 100 Unit/ml Soln (Insulin glargine) .Marland KitchenMarland KitchenMarland KitchenMarland Kitchen 30 units once daily at bedtime - dispense 1 month supply  Orders: A1C-[facility] (16109)   Orders: A1C-[facility] (60454) [facility]- Est Level  3 (09811)    Complete Medication List: 1)  Aspirin Childrens 81 Mg Chew (Aspirin) .... Take 1 tablet by mouth once a day 2)  Zestoretic 20-25 Mg Tabs (Lisinopril-hydrochlorothiazide) .Marland Kitchen.. 1 by mouth once daily for blood pressure 3)  Metformin Hcl 1000 Mg Tabs (Metformin hcl) .Marland Kitchen.. 1  tab am and 1 tab pm 4)  Micro-k 10 Meq Cpcr (Potassium chloride) .... 2 by mouth once daily 5)  Fluconazole 200 Mg Tabs (Fluconazole) .... Sig 1 by mouth today and repeat x1 in 2 days for vaginal itching 6)  Lantus 100 Unit/ml Soln (Insulin glargine) .... 30 units once daily at bedtime - dispense 1 month supply 7)  Calcium Carbonate 600 Mg Tabs (Calcium carbonate) .Marland Kitchen.. 1po qd

## 2011-01-24 ENCOUNTER — Ambulatory Visit (INDEPENDENT_AMBULATORY_CARE_PROVIDER_SITE_OTHER): Payer: Self-pay | Admitting: Family Medicine

## 2011-01-24 ENCOUNTER — Encounter: Payer: Self-pay | Admitting: Family Medicine

## 2011-01-24 VITALS — BP 118/85 | HR 66 | Temp 98.1°F | Ht 62.5 in | Wt 169.1 lb

## 2011-01-24 DIAGNOSIS — E119 Type 2 diabetes mellitus without complications: Secondary | ICD-10-CM

## 2011-01-24 LAB — COMPREHENSIVE METABOLIC PANEL
ALT: 17 U/L (ref 0–35)
AST: 15 U/L (ref 0–37)
Albumin: 4.1 g/dL (ref 3.5–5.2)
BUN: 13 mg/dL (ref 6–23)
Calcium: 9.2 mg/dL (ref 8.4–10.5)
Creat: 0.48 mg/dL (ref 0.40–1.20)
Glucose, Bld: 159 mg/dL — ABNORMAL HIGH (ref 70–99)
Total Bilirubin: 0.5 mg/dL (ref 0.3–1.2)

## 2011-01-24 NOTE — Progress Notes (Signed)
  Subjective:    Patient ID: Rebecca Terrell, female    DOB: 11-Oct-1970, 40 y.o.   MRN: [id number]  HPI  Follow up diabetes mellitus: Taking all medicines regularly and without problems. No episodes of low blood sugar. No blurry vision or increased urination. Highest blood sugar is 207, most around 120, lowest 92.   Review of SystemsPertinent review of systems: negative for fever or unusual weight change.     Objective:   Physical Exam    GENERALl: Well developed, well nourished, in no acute distress. NECK: Supple, FROM, without lymphadenopathy.  THYROID: normal without nodularity CAROTID ARTERIES: without bruits LUNGS: clear to auscultation bilaterally. No wheezes or rales. HEART: Regular rate and rhythm, no murmurs ABDOMEN: soft with positive bowel sounds NEURO: No gross focal deficits      Assessment & Plan:

## 2011-01-24 NOTE — Assessment & Plan Note (Addendum)
A1c today.  

## 2011-01-26 ENCOUNTER — Encounter: Payer: Self-pay | Admitting: Family Medicine

## 2011-01-26 ENCOUNTER — Other Ambulatory Visit: Payer: Self-pay | Admitting: Family Medicine

## 2011-01-26 MED ORDER — CALCIUM CARBONATE 600 MG PO TABS: 600.0000 mg | ORAL_TABLET | Freq: Two times a day (BID) | ORAL | Status: DC

## 2011-01-26 MED ORDER — METFORMIN HCL 1000 MG PO TABS: 1000.0000 mg | ORAL_TABLET | Freq: Two times a day (BID) | ORAL | Status: DC

## 2011-01-26 MED ORDER — POTASSIUM CHLORIDE ER 10 MEQ PO CPCR: 20.0000 meq | ORAL_CAPSULE | Freq: Every day | ORAL | Status: DC

## 2011-01-26 MED ORDER — ASPIRIN 81 MG PO CHEW: 81.0000 mg | CHEWABLE_TABLET | Freq: Every day | ORAL | Status: AC

## 2011-01-26 MED ORDER — LISINOPRIL-HYDROCHLOROTHIAZIDE 20-25 MG PO TABS: 1.0000 | ORAL_TABLET | Freq: Every day | ORAL | Status: DC

## 2011-01-26 MED ORDER — FLUCONAZOLE 200 MG PO TABS: ORAL_TABLET | ORAL | Status: DC

## 2011-01-26 MED ORDER — INSULIN GLARGINE 100 UNIT/ML ~~LOC~~ SOLN: 75.0000 [IU] | Freq: Every day | SUBCUTANEOUS | Status: DC

## 2011-03-01 ENCOUNTER — Telehealth: Payer: Self-pay | Admitting: Family Medicine

## 2011-03-01 DIAGNOSIS — E119 Type 2 diabetes mellitus without complications: Secondary | ICD-10-CM

## 2011-03-01 MED ORDER — INSULIN GLARGINE 100 UNIT/ML ~~LOC~~ SOLN: 75.0000 [IU] | Freq: Every day | SUBCUTANEOUS | Status: DC

## 2011-03-01 NOTE — Telephone Encounter (Signed)
Rx faxed.Laureen Ochs, Viann Shove

## 2011-03-01 NOTE — Telephone Encounter (Signed)
Dear Cliffton Asters Team I have printed off a new copy  Please fax it again Please tell her NOT to wait 3 weeks to call if it is not there THANKS! Denny Levy PS you will need a spasnih interpretor to make teh phone call

## 2011-03-01 NOTE — Telephone Encounter (Signed)
Checking status of insulin that was suppose to be called in 3 weeks ago, [facility]

## 2011-08-08 ENCOUNTER — Ambulatory Visit (INDEPENDENT_AMBULATORY_CARE_PROVIDER_SITE_OTHER): Payer: Self-pay | Admitting: Family Medicine

## 2011-08-08 ENCOUNTER — Encounter: Payer: Self-pay | Admitting: Family Medicine

## 2011-08-08 VITALS — BP 118/87 | HR 72 | Ht 62.5 in | Wt 166.0 lb

## 2011-08-08 DIAGNOSIS — I10 Essential (primary) hypertension: Secondary | ICD-10-CM

## 2011-08-08 DIAGNOSIS — B3731 Acute candidiasis of vulva and vagina: Secondary | ICD-10-CM | POA: Insufficient documentation

## 2011-08-08 DIAGNOSIS — E119 Type 2 diabetes mellitus without complications: Secondary | ICD-10-CM

## 2011-08-08 DIAGNOSIS — B373 Candidiasis of vulva and vagina: Secondary | ICD-10-CM

## 2011-08-08 DIAGNOSIS — Z23 Encounter for immunization: Secondary | ICD-10-CM

## 2011-08-08 LAB — COMPREHENSIVE METABOLIC PANEL
Alkaline Phosphatase: 71 U/L (ref 39–117)
Creat: 0.48 mg/dL — ABNORMAL LOW (ref 0.50–1.10)
Glucose, Bld: 123 mg/dL — ABNORMAL HIGH (ref 70–99)
Sodium: 138 mEq/L (ref 135–145)
Total Bilirubin: 0.5 mg/dL (ref 0.3–1.2)
Total Protein: 7.3 g/dL (ref 6.0–8.3)

## 2011-08-08 LAB — POCT GLYCOSYLATED HEMOGLOBIN (HGB A1C): Hemoglobin A1C: 12.7

## 2011-08-08 LAB — LDL CHOLESTEROL, DIRECT: Direct LDL: 132 mg/dL — ABNORMAL HIGH

## 2011-08-08 MED ORDER — FLUCONAZOLE 200 MG PO TABS: ORAL_TABLET | ORAL | Status: DC

## 2011-08-08 MED ORDER — METFORMIN HCL 1000 MG PO TABS: 1000.0000 mg | ORAL_TABLET | Freq: Two times a day (BID) | ORAL | Status: DC

## 2011-08-08 MED ORDER — INSULIN GLARGINE 100 UNIT/ML ~~LOC~~ SOLN: 75.0000 [IU] | Freq: Every day | SUBCUTANEOUS | Status: DC

## 2011-08-08 MED ORDER — POTASSIUM CHLORIDE ER 10 MEQ PO CPCR: 20.0000 meq | ORAL_CAPSULE | Freq: Every day | ORAL | Status: DC

## 2011-08-08 MED ORDER — LISINOPRIL-HYDROCHLOROTHIAZIDE 20-25 MG PO TABS: 1.0000 | ORAL_TABLET | Freq: Every day | ORAL | Status: DC

## 2011-08-08 NOTE — Progress Notes (Signed)
  Subjective:    Patient ID: Rebecca Terrell, female    DOB: 18-Mar-1971, 40 y.o.   MRN: [id number]  HPI  Number diabetes mellitus. She has continued to use her medications without problem. She's been trying to do better with her diet. She is currently under a lot of personal stressors. She's lost little bit of weight she thinks secondary to stressors. She continued to work full time at her job as a [employer] but no specific other exercise. She's had no episodes of low blood sugar. She's checking her blood sugar 3-4 times a week; the highest she has recorded is  between 200-300 and this was in the afternoon.  #2 the Followup hypertension. Denies chest pains, shortness of breath, headache. No problems with her medicines. She does need refills.  #3. Followup chronic recurrent yeast vaginitis. She continues to need the Diflucan treatment once or twice a month. Would like some refills.  Review of Systems Denies fever, sweats, chills. See history of present illness above for additional details.    Objective:   Physical Exam  Vital signs reviewed GENERALl: Well developed, well nourished, in no acute distress. NECK: Supple, FROM, without lymphadenopathy.  THYROID: normal without nodularity CAROTID ARTERIES: without bruits LUNGS: clear to auscultation bilaterally. No wheezes or rales. HEART: Regular rate and rhythm, no murmurs ABDOMEN: soft with positive bowel sounds NEURO: No gross focal deficits        Assessment & Plan:  #1. Diabetes mellitus. We'll check A1c and CMP. . If her A1c has not improved significantly we will increase her Lantus. She is currently only doing 50 units a day instead of the prescribed 75. To increase to 75 she would need a prescription for the larger syringe. #2. Hypertension. Currently well controlled. Medication refills and labs today. #3. Chronic recurrent yeast vaginitis. I will put her on suppressive therapy of one Diflucan every week. We discussed this  at length. I will also give her a few additional once a day she has breakthrough. She is due for CPE in November so she will follow up in 2-3 months and we'll do her CPE which he is well as her diabetes followup. I did give her flu shot today

## 2011-08-08 NOTE — Patient Instructions (Signed)
I will send you a note about your lab work. At last visit your A1c was 11 so I hope you're improving. I will see her back in January for your complete physical exam and checkup. Gave your flu shot today. Call me if there are any issues before I see he back.

## 2011-08-08 NOTE — Progress Notes (Signed)
ADDENDUM OPENED IN ERROR

## 2011-08-09 ENCOUNTER — Encounter: Payer: Self-pay | Admitting: Family Medicine

## 2011-11-14 ENCOUNTER — Encounter: Payer: Self-pay | Admitting: Family Medicine

## 2011-11-14 ENCOUNTER — Ambulatory Visit (INDEPENDENT_AMBULATORY_CARE_PROVIDER_SITE_OTHER): Payer: Self-pay | Admitting: Family Medicine

## 2011-11-14 VITALS — BP 110/76 | HR 78 | Temp 98.5°F | Ht 62.6 in | Wt 164.0 lb

## 2011-11-14 DIAGNOSIS — E119 Type 2 diabetes mellitus without complications: Secondary | ICD-10-CM

## 2011-11-14 LAB — POCT GLYCOSYLATED HEMOGLOBIN (HGB A1C): Hemoglobin A1C: 12.7

## 2011-11-14 MED ORDER — INSULIN GLARGINE 100 UNIT/ML ~~LOC~~ SOLN: 75.0000 [IU] | Freq: Every day | SUBCUTANEOUS | Status: DC

## 2011-11-14 MED ORDER — POTASSIUM CHLORIDE ER 10 MEQ PO CPCR: 20.0000 meq | ORAL_CAPSULE | Freq: Every day | ORAL | Status: DC

## 2011-11-14 MED ORDER — METFORMIN HCL 1000 MG PO TABS: 1000.0000 mg | ORAL_TABLET | Freq: Two times a day (BID) | ORAL | Status: DC

## 2011-11-14 MED ORDER — LISINOPRIL-HYDROCHLOROTHIAZIDE 20-25 MG PO TABS: 1.0000 | ORAL_TABLET | Freq: Every day | ORAL | Status: DC

## 2011-11-14 MED ORDER — FLUCONAZOLE 200 MG PO TABS: ORAL_TABLET | ORAL | Status: DC

## 2011-11-14 NOTE — Patient Instructions (Signed)
Please set Ms Rebecca Terrell up for a pharmacy clinic visit for uncontrolled diabetes. Please schedule her back with me in 3 months

## 2011-11-15 NOTE — Progress Notes (Signed)
  Subjective:    Patient ID: Rebecca Terrell, female    DOB: [birth year], 41 y.o.   MRN: [id number]  HPI  Here for checkup. No specific issues. #1. Diabetes mellitus: Her sugars most mornings are in the 200 range. She's not following a very good diet. She's not exercising. She's had no episodes of low blood sugars. She's had no increase in urinary frequency, no blurred vision. #2. Recurrent yeast vaginitis is much improved on the suppressive weekly dose of Diflucan.  Review of Systems Denies fever, sweats, chills. Denies chest pain, unusual weight gain or loss, denies numbness in her extremities. No visual changes    Objective:   Physical Exam Vital signs reviewed GENERALl: Well developed, well nourished, in no acute distress. NECK: Supple, FROM, without lymphadenopathy.  THYROID: normal without nodularity CAROTID ARTERIES: without bruits LUNGS: clear to auscultation bilaterally. No wheezes or rales. HEART: Regular rate and rhythm, no murmurs ABDOMEN: soft with positive bowel sounds NEURO: No gross focal deficits BREASTS: Bilaterally symmetrical with only mild fibrocystic changes, no worrisome masses. SKIN: Normal Refill, no rash.        Assessment & Plan:  Preventive health. She cannot do her Pap smear interval 2014. We discussed mammogram and I gave her information. She does not currently have insurance but may be able to qualify her for program. If so she would like to go ahead and get a mammogram. #2. Diabetes mellitus. Poor control which we just have not been able to get control of. I will send her back pharmacy clinic. I discussed at some length again dietary modification. She just doesn't seem to quite get the concept of this and also to language barrier or not. I will see her back in 2 months.

## 2011-11-23 ENCOUNTER — Ambulatory Visit: Payer: Self-pay | Admitting: Pharmacist

## 2011-11-30 ENCOUNTER — Ambulatory Visit (INDEPENDENT_AMBULATORY_CARE_PROVIDER_SITE_OTHER): Payer: Self-pay | Admitting: Pharmacist

## 2011-11-30 ENCOUNTER — Encounter: Payer: Self-pay | Admitting: Pharmacist

## 2011-11-30 VITALS — BP 126/80 | HR 72 | Temp 98.1°F | Ht 62.5 in | Wt 164.1 lb

## 2011-11-30 DIAGNOSIS — E119 Type 2 diabetes mellitus without complications: Secondary | ICD-10-CM

## 2011-11-30 MED ORDER — INSULIN GLARGINE 100 UNIT/ML ~~LOC~~ SOLN: 60.0000 [IU] | Freq: Every day | SUBCUTANEOUS | Status: DC

## 2011-11-30 NOTE — Progress Notes (Signed)
  Subjective:    Patient ID: Rebecca Terrell, female    DOB: 12/07/1970, 41 y.o.   MRN: [id number]  HPI Patient arrives in good spirits.  She is accompanied by her Daughter and a Friend who acts as an Equities trader.   She reports taking Lantus 50 units at bedtime (admits to taking this about every third day).   She takes metformin 1000mg  only once daily.   Provided dietary suggestions including decreasing "SUNNY -D" and increasing water.  Instructed to eat more vegetables AND whole fruits.      Review of Systems     Objective:   Physical Exam        Assessment & Plan:   Diabetes diagnosed in 2005 currently under poor control of blood glucose based on  . Lab Results  Component Value Date   HGBA1C 12.7 11/14/2011     ,home fasting CBG readings of >300. Control is suboptimal due to nonadherence with medications and suboptimal diet and exercise plan. Denies hypoglycemic events however does feel some symptoms (likly relative hypoglycemia when she does not eat for > 6 hours.  Able to verbalize appropriate hypoglycemia management plan. Adjusted dose of basal insulin Lantus (insulin glargine). Patient will start AM DOSING in attempt to improve adherence AND change to 60 units in the AM (starting tomorrow AM 2/23).  Patient will continue to titrate 2 unit/day if fasting CBGs > 100mg /dl until fasting CBGs reach goal or next visit on 3/19.  Written patient instructions provided.  Follow up in  Pharmacist Clinic Visit 3/19.   Total time in face to face counseling .

## 2011-11-30 NOTE — Progress Notes (Signed)
  Subjective:    Patient ID: Rebecca Terrell, female    DOB: 12-13-1970, 41 y.o.   MRN: [id number]  HPI Reviewed and agree with Dr. Macky Lower management.     Review of Systems     Objective:   Physical Exam        Assessment & Plan:

## 2011-11-30 NOTE — Patient Instructions (Signed)
Lantus 60 units starting tomorrow morning.    Increase 2 units each morning until morning fasting blood sugars are ~100 Take Metformin 1000mg  (1 pill) twice daily. Return to pharmacist clinic 3/19 at 10 AM

## 2011-11-30 NOTE — Assessment & Plan Note (Signed)
  Diabetes diagnosed in 2005 currently under poor control of blood glucose based on  . Lab Results  Component Value Date   HGBA1C 12.7 11/14/2011     ,home fasting CBG readings of >300. Control is suboptimal due to nonadherence with medications and suboptimal diet and exercise plan. Denies hypoglycemic events however does feel some symptoms (likly relative hypoglycemia when she does not eat for > 6 hours.  Able to verbalize appropriate hypoglycemia management plan. Adjusted dose of basal insulin Lantus (insulin glargine). Patient will start AM DOSING in attempt to improve adherence AND change to 60 units in the AM (starting tomorrow AM 2/23).  Patient will continue to titrate 2 unit/day if fasting CBGs > 100mg /dl until fasting CBGs reach goal or next visit on 3/19.  Written patient instructions provided.  Follow up in  Pharmacist Clinic Visit 3/19.   Total time in face to face counseling .

## 2011-12-25 ENCOUNTER — Ambulatory Visit: Payer: Self-pay | Admitting: Pharmacist

## 2011-12-28 ENCOUNTER — Ambulatory Visit (INDEPENDENT_AMBULATORY_CARE_PROVIDER_SITE_OTHER): Payer: Self-pay | Admitting: Pharmacist

## 2011-12-28 ENCOUNTER — Encounter: Payer: Self-pay | Admitting: Pharmacist

## 2011-12-28 VITALS — BP 128/88 | HR 78 | Ht 62.5 in | Wt 165.0 lb

## 2011-12-28 DIAGNOSIS — B373 Candidiasis of vulva and vagina: Secondary | ICD-10-CM

## 2011-12-28 DIAGNOSIS — E119 Type 2 diabetes mellitus without complications: Secondary | ICD-10-CM

## 2011-12-28 MED ORDER — FLUCONAZOLE 150 MG PO TABS: 150.0000 mg | ORAL_TABLET | ORAL | Status: DC

## 2011-12-28 NOTE — Progress Notes (Signed)
  Subjective:    Patient ID: Rebecca Terrell, female    DOB: [birth year], 41 y.o.   MRN: [id number]  HPI S: Patient arrives with her friend who serves as an Equities trader. [name] reports better control of her blood sugars lately, with ranges of 96-110 over the last week. She has been taking Lantus 80 units daily for the last week or so, having reached the point where she was supposed to stop increasing her dose. She reports blood sugars throughout the day higher than in the morning, ranging in the 200s. She reports decreased thirst throughout the day. She does not report having to get up at night to urinate any more, and she is also sleeping better. She reports some improvement in her diet, increasing water intake and increasing some fruits and vegetables. She does report some milk shakes from time to time. She would like to lose 5-10 pounds from her current weight of 165 pounds. She describes an understanding of how to manage low blood sugar.   Review of Systems     Objective:   Physical Exam        Assessment & Plan:   Diabetes of many yrs duration currently under good control of blood glucose based on  . Lab Results  Component Value Date   HGBA1C 12.7 11/14/2011     ,home fasting CBG readings of 90-110 and random CBG readings of lower 200s.  Denies hypoglycemic events.  Able to verbalize appropriate hypoglycemia management plan. Continued basal insulin Lantus (insulin glargine) at 80 units SubQ daily.  Written patient instructions provided.  Follow up in  Pharmacist Clinic Visit 4/23 with Me.   Total time in face to face counseling 35 minutes.  Patient seen with Birdena Crandall, PharmD Candidate and [place] Smith, Pharmacy Resident. Marland Kitchen

## 2011-12-28 NOTE — Patient Instructions (Addendum)
1. Continue your current regimen of Lantus 80 units every morning 2. Continue to check your blood sugars every morning and throughout the day 3. Please write down your blood sugar values when you take them so we can get a better picture of your overall control at your next visit 4. Continue to make strides with your diet, increasing intake of fruits, vegetables, and yogurt 5. Set a goal of two pounds weight loss between visits  1. Contine con su rgimen actual de Lantus 80 unidades cada maana 2. Contine controlando Insurance claims handler en la sangre todos los [place] y durante todo el da 3. Por favor, escriba sus valores de azcar en sangre cuando usted los toma para que podamos tener una mejor idea de su control general en su prxima visita 4. Continuar haciendo progresos con su dieta, aumentar la ingesta de frutas, verduras y yogur 5. Establecer una meta de prdida de [employer] las visitas

## 2011-12-28 NOTE — Progress Notes (Signed)
  Subjective:    Patient ID: Rebecca Terrell, female    DOB: October 01, 1971, 41 y.o.   MRN: [id number]  HPI REviewed and agree with Dr. Macky Lower management    Review of Systems     Objective:   Physical Exam        Assessment & Plan:

## 2011-12-28 NOTE — Assessment & Plan Note (Signed)
Diabetes of many yrs duration currently under good control of blood glucose based on  . Lab Results  Component Value Date   HGBA1C 12.7 11/14/2011     ,home fasting CBG readings of 90-110 and random CBG readings of lower 200s.  Denies hypoglycemic events.  Able to verbalize appropriate hypoglycemia management plan. Continued basal insulin Lantus (insulin glargine) at 80 units SubQ daily.  Written patient instructions provided.  Follow up in  Pharmacist Clinic Visit 4/23 with Me.   Total time in face to face counseling 35 minutes.  Patient seen with Birdena Crandall, PharmD Candidate and [place] Smith, Pharmacy Resident.

## 2012-01-29 ENCOUNTER — Ambulatory Visit: Payer: Self-pay | Admitting: Pharmacist

## 2012-02-19 ENCOUNTER — Encounter: Payer: Self-pay | Admitting: Pharmacist

## 2012-02-19 ENCOUNTER — Ambulatory Visit (INDEPENDENT_AMBULATORY_CARE_PROVIDER_SITE_OTHER): Payer: Self-pay | Admitting: Pharmacist

## 2012-02-19 DIAGNOSIS — E119 Type 2 diabetes mellitus without complications: Secondary | ICD-10-CM

## 2012-02-19 DIAGNOSIS — B373 Candidiasis of vulva and vagina: Secondary | ICD-10-CM

## 2012-02-19 DIAGNOSIS — E669 Obesity, unspecified: Secondary | ICD-10-CM

## 2012-02-19 LAB — POCT GLYCOSYLATED HEMOGLOBIN (HGB A1C): Hemoglobin A1C: 9.8

## 2012-02-19 MED ORDER — INSULIN GLARGINE 100 UNIT/ML ~~LOC~~ SOLN: 70.0000 [IU] | Freq: Every day | SUBCUTANEOUS | Status: DC

## 2012-02-19 MED ORDER — LISINOPRIL-HYDROCHLOROTHIAZIDE 20-25 MG PO TABS: 1.0000 | ORAL_TABLET | Freq: Every day | ORAL | Status: DC

## 2012-02-19 MED ORDER — METFORMIN HCL 1000 MG PO TABS: 1000.0000 mg | ORAL_TABLET | Freq: Two times a day (BID) | ORAL | Status: DC

## 2012-02-19 MED ORDER — POTASSIUM CHLORIDE ER 10 MEQ PO CPCR: 20.0000 meq | ORAL_CAPSULE | Freq: Every day | ORAL | Status: DC

## 2012-02-19 MED ORDER — INSULIN ASPART 100 UNIT/ML ~~LOC~~ SOLN: 10.0000 [IU] | Freq: Every day | SUBCUTANEOUS | Status: DC

## 2012-02-19 NOTE — Assessment & Plan Note (Signed)
Patient verbalized goal of being at a lower weight.  She will continue to work on losing weight.

## 2012-02-19 NOTE — Assessment & Plan Note (Signed)
  Diabetes of several yrs duration with improved control of blood glucose based on recent decrease in A1c down from > 12 to 9.8 today and lantus insulin and metformin use. Lab Results  Component Value Date   HGBA1C 12.7 11/14/2011     ,home fasting CBG readings of 80s-150 and random CBG readings of >200. Control is suboptimal due to diet in the evening. Denies hypoglycemic events but reports missing meals during the middle of the day can decrease her blood glucose.  Able to verbalize appropriate hypoglycemia management plan. Continued basal insulin Lantus (insulin glargine) at reduced dose of 70 units QAM   AND initiated Novolog samples at 10 units prior to evening meal. Written patient instructions provided.  Follow up in  Pharmacist Clinic Visit in 1 month.   Total time in face to face counseling 40 minutes.  Patient seen with Melynda Ripple, PharmD. Candidate, Janace Litten, PharmD, Pharmacy Resident and Tana Conch MD Resident.

## 2012-02-19 NOTE — Progress Notes (Signed)
  Subjective:    Patient ID: Rebecca Terrell, female    DOB: 17-Mar-1971, 41 y.o.   MRN: [id number]  HPI Returns for follow-up for diabetes management. She reports that she is doing well on lantus and metformin and reports better adherence. She reports no side-effects from the medications. Her only complaint is issues with insulin needles causing pain. She is checking her fasting blood sugars in the morning and reports ranges from 80s-150.  She notes feeling low in the late afternoon.   She states her evening (after dinner) blood sugars are > 200.    Review of Systems     Objective:   Physical Exam Novolog sample given to the patient, quantity 1, Lot Number [id number] EXP 05/07/2013  A1C today: 9.8     Assessment & Plan:   Diabetes of several yrs duration with improved control of blood glucose based on recent decrease in A1c down from > 12 to 9.8 today and lantus insulin and metformin use. Lab Results  Component Value Date   HGBA1C 12.7 11/14/2011     ,home fasting CBG readings of 80s-150 and random CBG readings of >200. Control is suboptimal due to diet in the evening. Denies hypoglycemic events but reports missing meals during the middle of the day can decrease her blood glucose.  Able to verbalize appropriate hypoglycemia management plan. Continued basal insulin Lantus (insulin glargine) at reduced dose of 70 units QAM   AND initiated Novolog samples at 10 units prior to evening meal. Written patient instructions provided.  Follow up in  Pharmacist Clinic Visit in 1 month.   Total time in face to face counseling 40 minutes.  Patient seen with Melynda Ripple, PharmD. Candidate, Janace Litten, PharmD, Pharmacy Resident and Tana Conch MD Resident.   Called in refills for all medications except fluconazole per patient request.

## 2012-02-19 NOTE — Assessment & Plan Note (Signed)
Did NOT refill fluconazole today.  Instructed patient that with lower blood sugars a trial off of this medication is reasonable.  Encouraged her to call if she feels like she is experiencing a return of vaginitis symptoms.   Patient expressed understanding of this plan.

## 2012-02-19 NOTE — Patient Instructions (Addendum)
Good to see you today!  Good job at taking your metformin and insulin and getting your A1c down - Decrease Lantus to 70 units daily in the morning - Start taking Novolog (fast acting insulin) 10 units once a day at dinner time (5:30-6pm) (this will last about 3 hours) - Continue to avoid eating larger portions and foods high in fats/carbohydrates - Encourage to not skip meals during the day. Important to eat something for lunch - Check sugars in the morning and 2 hours after dinner - Stop taking fluconazole - Return for follow-up visit in 4 weeks on Friday, June 14th @ 9:00am

## 2012-02-19 NOTE — Progress Notes (Signed)
Patient ID: Rebecca Terrell, female   DOB: 10-30-1970, 41 y.o.   MRN: [id number] Reviewed and agree with Dr. Macky Lower management.

## 2012-03-21 ENCOUNTER — Ambulatory Visit: Payer: Self-pay | Admitting: Pharmacist

## 2012-03-31 ENCOUNTER — Encounter: Payer: Self-pay | Admitting: Family Medicine

## 2012-03-31 ENCOUNTER — Ambulatory Visit (INDEPENDENT_AMBULATORY_CARE_PROVIDER_SITE_OTHER): Payer: Self-pay | Admitting: Family Medicine

## 2012-03-31 ENCOUNTER — Encounter: Payer: Self-pay | Admitting: Pharmacist

## 2012-03-31 ENCOUNTER — Ambulatory Visit (INDEPENDENT_AMBULATORY_CARE_PROVIDER_SITE_OTHER): Payer: Self-pay | Admitting: Pharmacist

## 2012-03-31 VITALS — BP 123/84 | HR 69 | Ht 62.0 in | Wt 164.4 lb

## 2012-03-31 VITALS — BP 123/84 | HR 69 | Temp 98.2°F | Ht 62.0 in | Wt 164.4 lb

## 2012-03-31 DIAGNOSIS — N9489 Other specified conditions associated with female genital organs and menstrual cycle: Secondary | ICD-10-CM | POA: Insufficient documentation

## 2012-03-31 DIAGNOSIS — E119 Type 2 diabetes mellitus without complications: Secondary | ICD-10-CM

## 2012-03-31 DIAGNOSIS — N949 Unspecified condition associated with female genital organs and menstrual cycle: Secondary | ICD-10-CM

## 2012-03-31 NOTE — Progress Notes (Signed)
  Subjective:    Patient ID: Rebecca Terrell, female    DOB: [birth year], 41 y.o.   MRN: [id number]  HPI Patient returns for follow-up for diabetes management. She reports that she is doing well on lantus, novolog and metformin. She did not bring in her meter but says she is having difficulty getting it to work at times. She reports no side-effects from the medications. She does complain of her insulin needs being very "hard" and would like smaller ones. She is checking her fasting blood sugars in the morning and reports ranges in the 100s. She states her evening (after dinner) blood sugars are > 200 and has not seen any 300s. She does not complain of hypoglycemic symptoms.   Patient complains of right labial mass that is itching x 1 week. Patient has been prescribed fluconazole in the past but is not currently on. Patient referred to Dr. Katrinka Blazing on this visit.   Review of Systems     Objective:   Physical Exam Lab Results  Component Value Date   HGBA1C 9.8 02/19/2012  Last A1C in February of 2013 = 12.7   Medication Samples have been provided to the patient.  Drug name: Novolog  Qty: 10ml vial (#1) LOT: [id number]  Exp.Date: 05/07/2013  The patient has been instructed regarding the correct time, dose, and frequency of taking this medication, including desired effects and most common side effects.      Assessment & Plan:   Diabetes of several yrs duration currently under fair control of blood glucose based on  . Lab Results  Component Value Date   HGBA1C 9.8 02/19/2012  ,home fasting CBG readings of ~100s and evening CBG readings of ~200s. Her control is improving based on her drop in A1C from 12.7 to 9.8. She did not bring in her meter today. Denies hypoglycemic events.  Able to verbalize appropriate hypoglycemia management plan. Increased dose of Novolog to 14 units with supper and continued basal insulin Lantus (insulin glargine) at 70 units along with her metformin. Written patient  instructions provided.  Follow up per Dr. Katrinka Blazing.   Total time in face to face counseling 15 minutes.  Patient seen with Brett Fairy, PharmD, Pharmacy Resident. Marland Kitchen

## 2012-03-31 NOTE — Patient Instructions (Signed)
Very nice to meet you. The area that was likely an abscess but it broke on the inside. You can do warm compresses to the area but I would not do any intervention unless you get fevers chills or becomes much more painful. At that time I would come back for an appointment. Otherwise please do warm compresses for 20 minutes 3 times a day Have a good week.

## 2012-03-31 NOTE — Progress Notes (Signed)
Patient ID: Rebecca Terrell, female   DOB: [birth year], 41 y.o.   MRN: [id number]  41 year old female with past medical history significant for uncontrolled diabetes coming in with a labile mass. Patient states that noticed it approximately one week ago was very tender and red but has seemed to improve since that time. Patient has had these in the past but has never need any intervention but has needed antibiotics previously. Patient states though over the course of the week it has seemed to decrease in size as well as pain. Patient denies any dysuria any abdominal pain any discharge or any numbness or tingling in the area.   Physical exam: Vitals reviewed General: No apparent distress obese female GU exam: On visual inspection there is no signs of any masses but on palpation patient does have an area of fluctuation in the posterior aspect of the right labia nontender with no abscess appreciated no crepitus no erythema the skin. No signs of draining

## 2012-03-31 NOTE — Patient Instructions (Addendum)
1. Increase your novolog insulin with supper to 14 units 2. Continue taking 70 units of lantus along with your metformin for your diabetes 3. On your next visit, bring in your meter to go over your blood sugars

## 2012-03-31 NOTE — Assessment & Plan Note (Signed)
Diabetes of several yrs duration currently under fair control of blood glucose based on  . Lab Results  Component Value Date   HGBA1C 9.8 02/19/2012  ,home fasting CBG readings of ~100s and evening CBG readings of ~200s. Her control is improving based on her drop in A1C from 12.7 to 9.8. She did not bring in her meter today. Denies hypoglycemic events.  Able to verbalize appropriate hypoglycemia management plan. Increased dose of Novolog to 14 units with supper and continued basal insulin Lantus (insulin glargine) at 70 units along with her metformin. Written patient instructions provided.  Follow up per Dr. Katrinka Blazing.   Total time in face to face counseling 15 minutes.  Patient seen with Brett Fairy, PharmD, Pharmacy Resident.

## 2012-03-31 NOTE — Progress Notes (Signed)
Patient ID: Rebecca Terrell, female   DOB: 05-Dec-1970, 41 y.o.   MRN: [id number] Reviewed and agree with Dr. Macky Lower documentation and management.

## 2012-03-31 NOTE — Assessment & Plan Note (Signed)
Likely secondary to her labial abscess that has taken care of itself. Patient still has some postinflammatory swelling but otherwise nothing abnormal. Patient warned the worsening of pain or fevers or chills that would make patient seek medical attention. Warm compresses 20 minutes 3 times a day.

## 2012-04-16 ENCOUNTER — Ambulatory Visit: Payer: Self-pay | Admitting: Family Medicine

## 2012-05-21 ENCOUNTER — Encounter: Payer: Self-pay | Admitting: Family Medicine

## 2012-05-21 ENCOUNTER — Ambulatory Visit (INDEPENDENT_AMBULATORY_CARE_PROVIDER_SITE_OTHER): Payer: Self-pay | Admitting: Family Medicine

## 2012-05-21 VITALS — BP 108/72 | HR 72 | Temp 98.2°F | Ht 62.6 in | Wt 165.0 lb

## 2012-05-21 DIAGNOSIS — I10 Essential (primary) hypertension: Secondary | ICD-10-CM

## 2012-05-21 DIAGNOSIS — E119 Type 2 diabetes mellitus without complications: Secondary | ICD-10-CM

## 2012-05-21 LAB — BASIC METABOLIC PANEL
CO2: 26 mEq/L (ref 19–32)
Calcium: 9.6 mg/dL (ref 8.4–10.5)
Chloride: 102 mEq/L (ref 96–112)
Glucose, Bld: 121 mg/dL — ABNORMAL HIGH (ref 70–99)
Sodium: 135 mEq/L (ref 135–145)

## 2012-05-21 LAB — POCT GLYCOSYLATED HEMOGLOBIN (HGB A1C): Hemoglobin A1C: 10.2

## 2012-05-21 MED ORDER — POTASSIUM CHLORIDE ER 10 MEQ PO CPCR: 20.0000 meq | ORAL_CAPSULE | Freq: Every day | ORAL | Status: DC

## 2012-05-21 MED ORDER — METFORMIN HCL 1000 MG PO TABS: 1000.0000 mg | ORAL_TABLET | Freq: Two times a day (BID) | ORAL | Status: DC

## 2012-05-21 MED ORDER — INSULIN ASPART 100 UNIT/ML ~~LOC~~ SOLN: 10.0000 [IU] | Freq: Every day | SUBCUTANEOUS | Status: DC

## 2012-05-21 MED ORDER — INSULIN GLARGINE 100 UNIT/ML ~~LOC~~ SOLN: 70.0000 [IU] | Freq: Every day | SUBCUTANEOUS | Status: DC

## 2012-05-21 MED ORDER — LISINOPRIL-HYDROCHLOROTHIAZIDE 20-25 MG PO TABS: 1.0000 | ORAL_TABLET | Freq: Every day | ORAL | Status: DC

## 2012-05-22 NOTE — Assessment & Plan Note (Signed)
A1C 10.2 today. Long discussion re diet ---she is still struggling with what is "good " to eat. We spent > 50% 45 minute ov in counseling and education re dm diet. rtc 3 m. I really think her main issue is diet. Her med compliance seems to have improved drastically. I will make no med changes at this visit but  If no improvement at next ov we must increase insulin coverage.

## 2012-05-22 NOTE — Progress Notes (Signed)
  Subjective:    Patient ID: Rebecca Terrell, female    DOB: 10/15/1970, 41 y.o.   MRN: [id number]  HPI  Follow up diabetes mellitus: Taking all medicines regularly and without problems. No episodes of low blood sugar. No blurry vision or increased urination. Feels like she is following better diet. HTN F/U: Taking medicines regularly without problems. Denies chest pain, shortness of breath. Also has a small "bump " on her back area that she wants me to address.   Review of Systems Pertinent review of systems: negative for fever or unusual weight change. See also HPI.    Objective:   Physical Exam  Vital signs reviewed GENERALl: Well developed, well nourished, in no acute distress. NECK: Supple, FROM, without lymphadenopathy.  THYROID: normal without nodularity CAROTID ARTERIES: without bruits LUNGS: clear to auscultation bilaterally. No wheezes or rales. HEART: Regular rate and rhythm, no murmurs ABDOMEN: soft with positive bowel sounds MSK: MOE x 4 SKIN no rash. Small sebaceous cyst 90.5 cm ) non inflamed left lower back.FEET; normal sensation, no callous, no deformity. Normal pulses.       Assessment & Plan:  1. Benign sebaceous cyst 2. Info given for mammogram (her request)

## 2012-05-22 NOTE — Assessment & Plan Note (Signed)
Good control 10 pound weight loss in last 18 months No md changes

## 2012-05-23 ENCOUNTER — Telehealth: Payer: Self-pay | Admitting: *Deleted

## 2012-05-23 ENCOUNTER — Encounter: Payer: Self-pay | Admitting: Family Medicine

## 2012-05-23 DIAGNOSIS — E119 Type 2 diabetes mellitus without complications: Secondary | ICD-10-CM

## 2012-05-23 MED ORDER — INSULIN ASPART 100 UNIT/ML ~~LOC~~ SOLN: SUBCUTANEOUS | Status: DC

## 2012-05-23 NOTE — Telephone Encounter (Signed)
Advised pharm [facility] ([facility]) per dr Jennette Kettle OK to inc novolog to 14Units.

## 2012-05-23 NOTE — Telephone Encounter (Signed)
Sanjuan Dame! Change to 14 u novalog THANKS! Denny Levy

## 2012-05-23 NOTE — Telephone Encounter (Signed)
Dr. Jennette Kettle, [facility] pharmacy just called me and wanted clarification on the insulin prescribed. novalog says 10 units, but the last times it was 14 units that Dr. Raymondo Band raised it to. The patient's interpreter had brought this to the pharmacy's attention due to the patient's A1C increasing. Should I have the Novalog raised to 14 units?,  [place] Dec

## 2012-05-26 ENCOUNTER — Other Ambulatory Visit (HOSPITAL_COMMUNITY): Payer: Self-pay | Admitting: Emergency Medicine

## 2012-05-26 DIAGNOSIS — Z1231 Encounter for screening mammogram for malignant neoplasm of breast: Secondary | ICD-10-CM

## 2012-06-06 ENCOUNTER — Ambulatory Visit (HOSPITAL_COMMUNITY)
Admission: RE | Admit: 2012-06-06 | Discharge: 2012-06-06 | Disposition: A | Discharge status: Home / Self Care | Payer: Self-pay | Source: Ambulatory Visit | Attending: Emergency Medicine | Admitting: Emergency Medicine

## 2012-06-06 DIAGNOSIS — Z1231 Encounter for screening mammogram for malignant neoplasm of breast: Secondary | ICD-10-CM | POA: Insufficient documentation

## 2012-06-12 ENCOUNTER — Other Ambulatory Visit: Payer: Self-pay | Admitting: Emergency Medicine

## 2012-06-12 DIAGNOSIS — R928 Other abnormal and inconclusive findings on diagnostic imaging of breast: Secondary | ICD-10-CM

## 2012-06-13 ENCOUNTER — Ambulatory Visit
Admission: RE | Admit: 2012-06-13 | Discharge: 2012-06-13 | Disposition: A | Discharge status: Home / Self Care | Payer: Self-pay | Source: Ambulatory Visit | Attending: Emergency Medicine | Admitting: Emergency Medicine

## 2012-06-13 DIAGNOSIS — R928 Other abnormal and inconclusive findings on diagnostic imaging of breast: Secondary | ICD-10-CM

## 2012-08-07 ENCOUNTER — Telehealth: Payer: Self-pay | Admitting: Family Medicine

## 2012-08-07 ENCOUNTER — Encounter: Payer: Self-pay | Admitting: Family Medicine

## 2012-08-07 ENCOUNTER — Ambulatory Visit (INDEPENDENT_AMBULATORY_CARE_PROVIDER_SITE_OTHER): Payer: Self-pay | Admitting: Family Medicine

## 2012-08-07 VITALS — BP 118/84 | HR 72 | Temp 98.8°F | Ht 62.5 in | Wt 162.0 lb

## 2012-08-07 DIAGNOSIS — K089 Disorder of teeth and supporting structures, unspecified: Secondary | ICD-10-CM

## 2012-08-07 DIAGNOSIS — K0889 Other specified disorders of teeth and supporting structures: Secondary | ICD-10-CM | POA: Insufficient documentation

## 2012-08-07 MED ORDER — IBUPROFEN 600 MG PO TABS: 600.0000 mg | ORAL_TABLET | Freq: Three times a day (TID) | ORAL | Status: DC | PRN

## 2012-08-07 MED ORDER — AMOXICILLIN-POT CLAVULANATE 875-125 MG PO TABS: 1.0000 | ORAL_TABLET | Freq: Two times a day (BID) | ORAL | Status: DC

## 2012-08-07 NOTE — Patient Instructions (Addendum)
[facility] antibiotic, una [employer] diario, por 10 dias  Tome ibuprofen 600 mg cada 6 horas si necesita para el dolor  Vamos a pedir para una cita con el dentista para usted   Regrese a la clinica si tiene fiebre y mas dolor     Diabetes: Si el Microbiologist noche es: <250 No necesita mas insulina (Novolog) 250-300 + 5 Novolog 300-350 +8 Novolog 350-400 +10 Novolog >400 Haga una cita a la Landscape architect

## 2012-08-07 NOTE — Telephone Encounter (Signed)
Pt went to pharmacy and states that she cannot afford the augmentin.  Needs something cheaper.

## 2012-08-07 NOTE — Telephone Encounter (Signed)
Patient seen by Dr. Madolyn Frieze this morning.  Will route note and call pharmacy back.  Gaylene Brooks, RN

## 2012-08-08 MED ORDER — PENICILLIN V POTASSIUM 500 MG PO TABS: 500.0000 mg | ORAL_TABLET | Freq: Three times a day (TID) | ORAL | Status: DC

## 2012-08-08 NOTE — Telephone Encounter (Signed)
I spoke with pharmacist yesterday afternoon around 5:15 pm. Augment costs about $50. We will switch to penicillin V 500 mg tid for 7 days.

## 2012-08-08 NOTE — Progress Notes (Signed)
  Subjective:    Patient ID: Rebecca Terrell, female    DOB: [birth year], 41 y.o.   MRN: [id number]  HPI # Tooth pain for 1 week Left lower teeth The pain is persistent and not getting worse.  Alleviated by: not using that side of mouth to chew Meds tried:  occasional ibuprofen/Alleve does help Exacerbated by: chewing  She has not seen a dentist "for a long time".  Review of Systems Denies fevers/chills/nausea/rash  Allergies, medication, past medical history reviewed.  Poorly controlled T2 DM--02/2012 A1c 9.8. She has been seeing Dr. Raymondo Band for management. She reports compliance with oral medications and insulin. She checks her sugars twice a day. Usually 120 in the morning and 200s prior to her evening meal. Recently though, her evening sugars have been slightly higher at 270.  ROS: denies increased urination/thirst/feeling unwell except for tooth pain    Objective:   Physical Exam GEN: NAD; accompanied by friend who serves as [place] interpreter HEENT:   Head: [place]/AT; no facial rash or tenderness   Eyes: normal conjunctiva without injection or tearing   Nose: no rhinorrhea, normal turbinates   Mouth: MMM; no tonsillar adenopathy; no oropharyngeal erythema; no obvious caries; tender with palpation with wooden tongue depressor of 2 most posterior left lower molars; no gingivitis NECK: no LAD PULM: NI WOB    Assessment & Plan:

## 2012-08-08 NOTE — Assessment & Plan Note (Addendum)
Pain in most posterior 2 left lower molars for the past week without cellulitis or signs of systemic infection.  However, due to uncertainty about when she will see dentist, will treat for possible pulpitis/abscess with penicillin V for 7 days.  Continue NSAIDs prn for pain. She has an orange card and will be referred to a dentist.  She also has a history of poorly controlled diabetes that seems to be under better control. She sees Dr. Raymondo Band for this. Her evening sugars are higher than usual recently, so she was given a sliding scale to use for additional Novolog. See patient instructions.

## 2012-08-20 ENCOUNTER — Encounter: Payer: Self-pay | Admitting: Home Health Services

## 2012-08-27 ENCOUNTER — Ambulatory Visit (INDEPENDENT_AMBULATORY_CARE_PROVIDER_SITE_OTHER): Payer: Self-pay | Admitting: Family Medicine

## 2012-08-27 ENCOUNTER — Encounter: Payer: Self-pay | Admitting: Family Medicine

## 2012-08-27 VITALS — BP 134/91 | HR 76 | Temp 98.7°F | Wt 162.5 lb

## 2012-08-27 DIAGNOSIS — K089 Disorder of teeth and supporting structures, unspecified: Secondary | ICD-10-CM

## 2012-08-27 DIAGNOSIS — Z0000 Encounter for general adult medical examination without abnormal findings: Secondary | ICD-10-CM

## 2012-08-27 DIAGNOSIS — E1165 Type 2 diabetes mellitus with hyperglycemia: Secondary | ICD-10-CM

## 2012-08-27 DIAGNOSIS — K0889 Other specified disorders of teeth and supporting structures: Secondary | ICD-10-CM

## 2012-08-27 DIAGNOSIS — I10 Essential (primary) hypertension: Secondary | ICD-10-CM

## 2012-08-27 LAB — COMPREHENSIVE METABOLIC PANEL
ALT: 14 U/L (ref 0–35)
BUN: 21 mg/dL (ref 6–23)
CO2: 26 mEq/L (ref 19–32)
Calcium: 9.7 mg/dL (ref 8.4–10.5)
Chloride: 103 mEq/L (ref 96–112)
Creat: 0.55 mg/dL (ref 0.50–1.10)
Glucose, Bld: 184 mg/dL — ABNORMAL HIGH (ref 70–99)

## 2012-08-27 LAB — POCT GLYCOSYLATED HEMOGLOBIN (HGB A1C): Hemoglobin A1C: 11.3

## 2012-08-27 MED ORDER — PENICILLIN V POTASSIUM 500 MG PO TABS: ORAL_TABLET | ORAL | Status: DC

## 2012-08-27 MED ORDER — IBUPROFEN 600 MG PO TABS: 600.0000 mg | ORAL_TABLET | Freq: Three times a day (TID) | ORAL | Status: DC | PRN

## 2012-08-27 MED ORDER — METFORMIN HCL 1000 MG PO TABS: 1000.0000 mg | ORAL_TABLET | Freq: Two times a day (BID) | ORAL | Status: DC

## 2012-08-27 NOTE — Patient Instructions (Addendum)
Return to clinic 3 months I will send you your lab results CALL if your tooth gets worse!

## 2012-08-29 NOTE — Progress Notes (Signed)
  Subjective:    Patient ID: Rebecca Terrell, female    DOB: Feb 20, 1971, 41 y.o.   MRN: [id number]  HPI  Here for checkup. 1. DM: She's not really been following her diabetic diet very well her sugars have been somewhat elevated. Using 70 u at night and 15 am. Needs refills. 2. Tooth pain--started having pain again after she finished abx. Does not have a scheduled dental appt yet but [facility] Would like refill on pain meds and abx. Ibuprofen seems to work well 3. BP seems to be OK--no chest pain or SOB    Review of Systems  Constitutional: Negative for activity change and fatigue.  HENT: Negative for neck pain.   Eyes: Negative for pain.  Respiratory: Negative for cough and shortness of breath.   Cardiovascular: Negative for chest pain and leg swelling.  Gastrointestinal: Negative for abdominal pain.  Genitourinary: Negative for flank pain.  Musculoskeletal: Negative for back pain.  Neurological: Negative for weakness, light-headedness and headaches.  Psychiatric/Behavioral: Negative for confusion, dysphoric mood and decreased concentration.  She denies increased urinary frequency. Denies blurred vision. Has had no weight change.      Objective:   Physical Exam  Constitutional: She is oriented to person, place, and time. She appears well-developed and well-nourished.  HENT:  Head: Normocephalic and atraumatic.  Right Ear: External ear normal.  Left Ear: External ear normal.  Nose: Nose normal.  Mouth/Throat: Oropharynx is clear and moist.  Eyes: Conjunctivae normal and EOM are normal. Pupils are equal, round, and reactive to light.  Neck: Normal range of motion.  Cardiovascular: Normal rate, regular rhythm and normal heart sounds.   Pulmonary/Chest: Effort normal and breath sounds normal.  Abdominal: Soft. There is no rebound and no guarding.  Musculoskeletal: Normal range of motion.  Neurological: She is alert and oriented to person,  place, and time. She has normal reflexes.  Skin: No rash noted.  Psychiatric: She has a normal mood and affect. Her behavior is normal. Judgment normal.    Vital signs reviewed. GENERAL: Well-developed, well-nourished, no acute distress. CARDIOVASCULAR: Regular rate and rhythm no murmur gallop or rub LUNGS: Clear to auscultation bilaterally, no rales or wheeze. ABDOMEN: Soft positive bowel sounds NEURO: No gross focal neurological deficits. MSK: Movement of extremity x 4.         Assessment & Plan:  Well adult check: pap due next year. Discussed exercise and calcium, flu shot. 2. DM--check a1C

## 2012-09-16 ENCOUNTER — Encounter: Payer: Self-pay | Admitting: Family Medicine

## 2012-11-26 ENCOUNTER — Telehealth: Payer: Self-pay | Admitting: *Deleted

## 2012-11-26 NOTE — Telephone Encounter (Signed)
Patient came in yesterday to have teeth cleaned.  Patient is on Insulin and ASA 81 mg daily.  Patient has "many loose teeth with abscesses with purulent exudate from teeth in molar areas."  Patient was started on amoxicillin 500 mg TID x 7 days to help clear up infection.  Dentist is "hesitant" to start periodontal treatment due to diabetes and would like advice on how to proceed with treatment due to diabetes and active infection.  Will route note to Dr. Jennette Kettle for advice and call back.  Gaylene Brooks, RN

## 2012-11-27 ENCOUNTER — Telehealth: Payer: Self-pay | Admitting: Family Medicine

## 2012-11-27 NOTE — Telephone Encounter (Signed)
She should not need any specific pre medication. I think getting her teeth fixed will HELP her diabetic control. Id dentist would feel more comfortbale with a course of antibiotics before cleaning, I can call that in. He or she could safely stop the aspirin for a week or so if he / she wants withouy any ill effects. Most important is to get the teeth done! THANKS! Denny Levy

## 2012-11-27 NOTE — Telephone Encounter (Signed)
See other phone note about this THANKS! Denny Levy

## 2012-11-27 NOTE — Telephone Encounter (Signed)
Returned call to Erskine Squ[employer]b [employer]) and informed of info per Dr. Jennette Kettle.  Patient has "a lot of exudate when touching the teeth."  Dentist will be able to give patient course of antibiotics and have patient stop ASA pre-procedure.    Gaylene Brooks, RN

## 2012-11-27 NOTE — Telephone Encounter (Signed)
The Dental Hygientist would like to speak to Dr. Jennette Kettle about possibly pre-medicating this patient for treating severe infection in the mouth.

## 2012-12-24 ENCOUNTER — Encounter: Payer: Self-pay | Admitting: *Deleted

## 2013-01-26 ENCOUNTER — Encounter: Payer: Self-pay | Admitting: *Deleted

## 2013-02-04 ENCOUNTER — Ambulatory Visit: Payer: PRIVATE HEALTH INSURANCE | Admitting: Family Medicine

## 2013-02-04 ENCOUNTER — Encounter: Payer: Self-pay | Admitting: Family Medicine

## 2013-02-04 ENCOUNTER — Ambulatory Visit (INDEPENDENT_AMBULATORY_CARE_PROVIDER_SITE_OTHER): Payer: PRIVATE HEALTH INSURANCE | Admitting: Family Medicine

## 2013-02-04 VITALS — BP 120/84 | HR 87 | Temp 98.6°F | Ht 62.5 in | Wt 158.3 lb

## 2013-02-04 DIAGNOSIS — E119 Type 2 diabetes mellitus without complications: Secondary | ICD-10-CM

## 2013-02-04 DIAGNOSIS — E1165 Type 2 diabetes mellitus with hyperglycemia: Secondary | ICD-10-CM

## 2013-02-04 LAB — POCT GLYCOSYLATED HEMOGLOBIN (HGB A1C): Hemoglobin A1C: 12.8

## 2013-02-04 MED ORDER — METFORMIN HCL 1000 MG PO TABS: 1000.0000 mg | ORAL_TABLET | Freq: Two times a day (BID) | ORAL | Status: DC

## 2013-02-04 MED ORDER — INSULIN GLARGINE 100 UNIT/ML ~~LOC~~ SOLN: SUBCUTANEOUS | Status: DC

## 2013-02-04 MED ORDER — FLUCONAZOLE 200 MG PO TABS: ORAL_TABLET | ORAL | Status: DC

## 2013-02-04 MED ORDER — INSULIN ASPART 100 UNIT/ML ~~LOC~~ SOLN: SUBCUTANEOUS | Status: DC

## 2013-02-04 MED ORDER — POTASSIUM CHLORIDE ER 10 MEQ PO CPCR: 20.0000 meq | ORAL_CAPSULE | Freq: Every day | ORAL | Status: DC

## 2013-02-04 MED ORDER — LISINOPRIL-HYDROCHLOROTHIAZIDE 20-25 MG PO TABS: 1.0000 | ORAL_TABLET | Freq: Every day | ORAL | Status: DC

## 2013-02-04 NOTE — Patient Instructions (Addendum)
I have written you glargine insulin for 70-90 units as we are likely  Going to have to go up on the dose. I want you to increase to 80 units at this time.  Your A1 C was 12---the goal is 7. Last time you were 9.  YOU MUST find a way to get hold of your sugar (bread, ice cream etc) cravings as your A1C has been running way to high for too long. You are starting to head down the path to kidney damage which could end up with you needing dialysis or eye damage causing blindness. NOW is the time to make the changes!

## 2013-02-11 ENCOUNTER — Telehealth: Payer: Self-pay | Admitting: *Deleted

## 2013-02-11 NOTE — Assessment & Plan Note (Signed)
Continued noncompliance with diet. I spent greater than 50% of her 40 minute office visit in counseling education using her interpreter. We discussed diet, diet, diet and exercise. She is twice been to diabetic nutritionist and does not really want to go again. I not sure how much of it she really understood. I gave her worst case scenario about possible dialysis, with vision loss etc. in the near future she does not get control for diabetes. I'll see her back in 2 months.

## 2013-02-11 NOTE — Telephone Encounter (Signed)
[facility]acy would like verification on potassium rx - states that pt has not had filled since January and had tablets on last script instead of 10 meq. Thanks ! Sherma Vanmetre, Harold Hedge, RN

## 2013-02-11 NOTE — Progress Notes (Signed)
  Subjective:    Patient ID: Aura Dials, female    DOB: [birth year], 42 y.o.   MRN: [id number]  HPI Follow diabetes mellitus. She's not been doing much different with her diet. Says her sugars are out of control. She is taking her medicines regularly. She continues to drink a lot of soda eat a lot of starches particularly flour tortillas and breath and positive. No episodes of low blood sugar.   Review of Systems See history of present illness. Additionally she's had no increase in urination, no increase in thirst, no blurred vision, no chest pain.    Objective:   Physical Exam  Vital signs are reviewed GENERAL: Well-developed female in no acute distress NECK: No lymphadenopathy no carotid bruits and no thyromegaly CARDIOVASCULAR: Regular rate and rhythm without murmur LUNGS: Clear to auscultation bilaterally      Assessment & Plan:

## 2013-02-12 NOTE — Telephone Encounter (Signed)
Dear [facility] [employer] Team Plz call themn---she can EITHER take 2 tablets of teh 10 mg or ONE tablet of the 20 mg.  If they issue the 10 mg tabs they need to give her #60 for a month ior #180 for a month THANKS! Denny Levy

## 2013-02-13 NOTE — Telephone Encounter (Signed)
Called in [facility] pharmacy line and left the message below on patient's medication

## 2013-03-06 ENCOUNTER — Encounter (HOSPITAL_COMMUNITY): Payer: Self-pay | Admitting: *Deleted

## 2013-03-06 ENCOUNTER — Telehealth: Payer: Self-pay | Admitting: Family Medicine

## 2013-03-06 ENCOUNTER — Emergency Department (INDEPENDENT_AMBULATORY_CARE_PROVIDER_SITE_OTHER)
Admission: EM | Admit: 2013-03-06 | Discharge: 2013-03-06 | Disposition: A | Discharge status: Home / Self Care | Payer: PRIVATE HEALTH INSURANCE | Source: Home / Self Care | Attending: Family Medicine | Admitting: Family Medicine

## 2013-03-06 ENCOUNTER — Other Ambulatory Visit (HOSPITAL_COMMUNITY)
Admission: RE | Admit: 2013-03-06 | Discharge: 2013-03-06 | Disposition: A | Discharge status: Home / Self Care | Payer: PRIVATE HEALTH INSURANCE | Source: Ambulatory Visit | Attending: Family Medicine | Admitting: Family Medicine

## 2013-03-06 DIAGNOSIS — N730 Acute parametritis and pelvic cellulitis: Secondary | ICD-10-CM

## 2013-03-06 DIAGNOSIS — N390 Urinary tract infection, site not specified: Secondary | ICD-10-CM

## 2013-03-06 DIAGNOSIS — Z113 Encounter for screening for infections with a predominantly sexual mode of transmission: Secondary | ICD-10-CM | POA: Insufficient documentation

## 2013-03-06 DIAGNOSIS — N760 Acute vaginitis: Secondary | ICD-10-CM | POA: Insufficient documentation

## 2013-03-06 LAB — POCT URINALYSIS DIP (DEVICE)
Glucose, UA: >=1000 mg/dL — AB
Ketones, ur: NEGATIVE mg/dL
Protein, ur: NEGATIVE mg/dL
Urobilinogen, UA: 0.2 mg/dL (ref 0.0–1.0)

## 2013-03-06 LAB — POCT PREGNANCY, URINE: Preg Test, Ur: NEGATIVE

## 2013-03-06 LAB — GLUCOSE, CAPILLARY: Glucose-Capillary: 254 mg/dL — ABNORMAL HIGH (ref 70–99)

## 2013-03-06 MED ORDER — METRONIDAZOLE 500 MG PO TABS: 500.0000 mg | ORAL_TABLET | Freq: Two times a day (BID) | ORAL | Status: DC

## 2013-03-06 MED ORDER — CEFTRIAXONE SODIUM 250 MG IJ SOLR
INTRAMUSCULAR | Status: AC
  Filled 2013-03-06: qty 250

## 2013-03-06 MED ORDER — DOXYCYCLINE HYCLATE 100 MG PO CAPS: 100.0000 mg | ORAL_CAPSULE | Freq: Two times a day (BID) | ORAL | Status: DC

## 2013-03-06 MED ORDER — CEFTRIAXONE SODIUM 250 MG IJ SOLR
250.0000 mg | Freq: Once | INTRAMUSCULAR | Status: AC
  Administered 2013-03-06: 250 mg via INTRAMUSCULAR

## 2013-03-06 NOTE — ED Notes (Signed)
Pt  Reports  Symptoms  Of  Vaginal discharge  With  Itching  As  Well  As  Signs  Of a  uti  To  Include     Burning  /  Frequency          She   Walks   Upright with a  Slow  Steady  Gait

## 2013-03-06 NOTE — ED Provider Notes (Signed)
History     CSN: [id number]  Arrival date & time 03/06/13  1129   None     Chief Complaint  Patient presents with  . Vaginal Discharge    (Consider location/radiation/quality/duration/timing/severity/associated sxs/prior treatment) HPI Comments: Majority of pt communication through pt's friend as Nurse, learning disability.    Pt presents c/o urinary frequency, dysuria, vaginal discharge, mild abdominal pain, and mild flank pain.  All symptoms began 3 days ago.  The dysuria has been getting slightly worse despite OTC azo.  She describes the discharge as white and slightly bloody yesterday but she has not noticed anything yet today.  The abdominal pain is mild and diffuse, she cannot pinpoint a particular spot that is any worse.  The flank pain is also mild and bilateral.  The pt notes that she has been single for some time and just started having intercourse again.    Patient is a 42 y.o. female presenting with vaginal discharge.  Vaginal Discharge Associated symptoms: abdominal pain (mild) and dysuria   Associated symptoms: no fever, no nausea and no vomiting     History reviewed. No pertinent past medical history.  History reviewed. No pertinent past surgical history.  No family history on file.  History  Substance Use Topics  . Smoking status: Never Smoker   . Smokeless tobacco: Never Used  . Alcohol Use: No    OB History   Grav Para Term Preterm Abortions TAB SAB Ect Mult Living                  Review of Systems  Constitutional: Negative for fever and chills.  Eyes: Negative for visual disturbance.  Respiratory: Negative for cough and shortness of breath.   Cardiovascular: Negative for chest pain, palpitations and leg swelling.  Gastrointestinal: Positive for abdominal pain (mild). Negative for nausea, vomiting and diarrhea.  Endocrine: Negative for polydipsia and polyuria.  Genitourinary: Positive for dysuria, urgency, frequency, flank pain and vaginal discharge. Negative for  hematuria and decreased urine volume.  Musculoskeletal: Negative for myalgias and arthralgias.  Skin: Negative for rash.  Neurological: Negative for dizziness, weakness and light-headedness.    Allergies  Review of patient's allergies indicates no known allergies.  Home Medications   Current Outpatient Rx  Name  Route  Sig  Dispense  Refill  . aspirin 81 MG chewable tablet   Oral   Chew 1 tablet (81 mg total) by mouth daily.   90 tablet   3   . doxycycline (VIBRAMYCIN) 100 MG capsule   Oral   Take 1 capsule (100 mg total) by mouth 2 (two) times daily.   28 capsule   0   . fluconazole (DIFLUCAN) 200 MG tablet      Take one tablet every other day for three doses for vaginal yeast infection. After infection has cleared continue taking one tablet a week for suppression.   30 tablet   12   . ibuprofen (ADVIL,MOTRIN) 600 MG tablet   Oral   Take 1 tablet (600 mg total) by mouth every 8 (eight) hours as needed for pain.   90 tablet   2   . insulin aspart (NOVOLOG) 100 UNIT/ML injection      Inject 14 u subcutaneously with supper every day   1 vial   PRN   . insulin glargine (LANTUS) 100 UNIT/ML injection      Inject 70-90 inits into skin daily as directed   40 mL   12   . lisinopril-hydrochlorothiazide (PRINZIDE,ZESTORETIC) 20-25 MG  per tablet   Oral   Take 1 tablet by mouth daily.   90 tablet   3   . metFORMIN (GLUCOPHAGE) 1000 MG tablet   Oral   Take 1 tablet (1,000 mg total) by mouth 2 (two) times daily with a meal.   60 tablet   12   . metroNIDAZOLE (FLAGYL) 500 MG tablet   Oral   Take 1 tablet (500 mg total) by mouth 2 (two) times daily.   28 tablet   0   . potassium chloride (MICRO-K) 10 MEQ CR capsule   Oral   Take 2 capsules (20 mEq total) by mouth daily.   60 capsule   12     BP 120/80  Pulse 73  Temp(Src) 98.3 F (36.8 C) (Oral)  Resp 20  SpO2 100%  LMP 02/21/2013  Physical Exam  Nursing note and vitals reviewed. Constitutional:  She is oriented to person, place, and time. Vital signs are normal. She appears well-developed and well-nourished. No distress.  HENT:  Head: Atraumatic.  Eyes: EOM are normal. Pupils are equal, round, and reactive to light.  Cardiovascular: Normal rate, regular rhythm and normal heart sounds.  Exam reveals no gallop and no friction rub.   No murmur heard. Pulmonary/Chest: Effort normal and breath sounds normal. No respiratory distress. She has no wheezes. She has no rales.  Abdominal: Soft. There is no hepatosplenomegaly. There is generalized tenderness. There is no rigidity, no guarding and no CVA tenderness.  Genitourinary: Cervix exhibits motion tenderness. Cervix exhibits no discharge. Vaginal discharge (white, thin) found.  Neurological: She is alert and oriented to person, place, and time. She has normal strength.  Skin: Skin is warm and dry. She is not diaphoretic.  Psychiatric: She has a normal mood and affect. Her behavior is normal. Judgment normal.    ED Course  Procedures (including critical care time)  Labs Reviewed  GLUCOSE, CAPILLARY - Abnormal; Notable for the following:    Glucose-Capillary 254 (*)    All other components within normal limits  POCT URINALYSIS DIP (DEVICE) - Abnormal; Notable for the following:    Glucose, UA >=1000 (*)    Hgb urine dipstick TRACE (*)    Leukocytes, UA TRACE (*)    All other components within normal limits  URINE CULTURE  POCT PREGNANCY, URINE  CERVICOVAGINAL ANCILLARY ONLY   No results found.   1. PID (acute pelvic inflammatory disease)   2. UTI (lower urinary tract infection)       MDM  Given pt exam findings and risk factors, will treat for mild early PID.  Stressed to pt importance of f/u, especially if she gets worse or does not improve.    Pt has no symptoms or signs of DKA.  Urged prompt and regular f/u with her PCP regarding DM management  Meds ordered this encounter  Medications  . cefTRIAXone (ROCEPHIN)  injection 250 mg    Sig:   . doxycycline (VIBRAMYCIN) 100 MG capsule    Sig: Take 1 capsule (100 mg total) by mouth 2 (two) times daily.    Dispense:  28 capsule    Refill:  0  . metroNIDAZOLE (FLAGYL) 500 MG tablet    Sig: Take 1 tablet (500 mg total) by mouth 2 (two) times daily.    Dispense:  28 tablet    Refill:  0           Graylon Good, PA-C 03/06/13 2003

## 2013-03-06 NOTE — Telephone Encounter (Signed)
Pt has burning, itching and pain to urniate. Had white discharge 2 days ago Please advise

## 2013-03-06 NOTE — Telephone Encounter (Signed)
Unable to return call prior to pt coming into office for further advice. Advised since symptoms have been for the past week with no improvement using OTC meds such as Azo and increased fluids that she needed to be seen. Unable to return this afternoon - suggested she seek treatment at [facility]. Pt and friend agree that she will go there from office at this time. Pt verbalizes understanding through friend who is interrupting.  Wyatt Haste, RN-BSN

## 2013-03-07 LAB — URINE CULTURE

## 2013-03-07 NOTE — ED Provider Notes (Signed)
Medical screening examination/treatment/procedure(s) were performed by resident physician or non-physician practitioner and as supervising physician I was immediately available for consultation/collaboration.   Adine Heimann DOUGLAS MD.   Aksh Swart D Saed Hudlow, MD 03/07/13 1111 

## 2013-03-09 NOTE — ED Notes (Signed)
Chart review.

## 2013-03-15 NOTE — ED Notes (Signed)
UA culture non conclusive, multiple colonies

## 2013-03-16 NOTE — ED Notes (Signed)
GC/Chlamydia neg., Affirm: Gardnerella pos., Candida and Trich neg.  Pt. adequately treated with Flagyl. Rebecca Terrell 03/16/2013

## 2013-04-16 ENCOUNTER — Other Ambulatory Visit: Payer: Self-pay

## 2013-05-14 ENCOUNTER — Ambulatory Visit: Payer: Self-pay

## 2013-06-03 ENCOUNTER — Encounter: Payer: Self-pay | Admitting: Family Medicine

## 2013-06-03 ENCOUNTER — Ambulatory Visit (INDEPENDENT_AMBULATORY_CARE_PROVIDER_SITE_OTHER): Payer: No Typology Code available for payment source | Admitting: Family Medicine

## 2013-06-03 VITALS — BP 114/86 | HR 86 | Temp 98.9°F | Ht 62.5 in | Wt 160.8 lb

## 2013-06-03 DIAGNOSIS — E1165 Type 2 diabetes mellitus with hyperglycemia: Secondary | ICD-10-CM

## 2013-06-03 DIAGNOSIS — R300 Dysuria: Secondary | ICD-10-CM

## 2013-06-03 DIAGNOSIS — E119 Type 2 diabetes mellitus without complications: Secondary | ICD-10-CM

## 2013-06-03 DIAGNOSIS — Z23 Encounter for immunization: Secondary | ICD-10-CM

## 2013-06-03 LAB — POCT URINALYSIS DIPSTICK
Glucose, UA: NEGATIVE
Nitrite, UA: NEGATIVE
Urobilinogen, UA: 0.2

## 2013-06-03 LAB — COMPREHENSIVE METABOLIC PANEL
AST: 11 U/L (ref 0–37)
Albumin: 4.2 g/dL (ref 3.5–5.2)
Alkaline Phosphatase: 79 U/L (ref 39–117)
Potassium: 3.8 mEq/L (ref 3.5–5.3)
Sodium: 137 mEq/L (ref 135–145)
Total Protein: 7.9 g/dL (ref 6.0–8.3)

## 2013-06-03 LAB — POCT UA - MICROSCOPIC ONLY

## 2013-06-03 LAB — POCT GLYCOSYLATED HEMOGLOBIN (HGB A1C): Hemoglobin A1C: 9.7

## 2013-06-03 MED ORDER — CEPHALEXIN 500 MG PO CAPS: 500.0000 mg | ORAL_CAPSULE | Freq: Three times a day (TID) | ORAL | Status: DC

## 2013-06-03 MED ORDER — POTASSIUM CHLORIDE ER 10 MEQ PO CPCR: 20.0000 meq | ORAL_CAPSULE | Freq: Every day | ORAL | Status: DC

## 2013-06-03 MED ORDER — INSULIN ASPART 100 UNIT/ML ~~LOC~~ SOLN: SUBCUTANEOUS | Status: DC

## 2013-06-03 MED ORDER — INSULIN GLARGINE 100 UNIT/ML ~~LOC~~ SOLN: SUBCUTANEOUS | Status: DC

## 2013-06-03 MED ORDER — FLUCONAZOLE 200 MG PO TABS: ORAL_TABLET | ORAL | Status: DC

## 2013-06-03 MED ORDER — METFORMIN HCL 1000 MG PO TABS: 1000.0000 mg | ORAL_TABLET | Freq: Two times a day (BID) | ORAL | Status: DC

## 2013-06-03 MED ORDER — LISINOPRIL-HYDROCHLOROTHIAZIDE 20-25 MG PO TABS: 1.0000 | ORAL_TABLET | Freq: Every day | ORAL | Status: DC

## 2013-06-03 NOTE — Progress Notes (Signed)
  Subjective:    Patient ID: Rebecca Terrell, female    DOB: 05-05-1971, 42 y.o.   MRN: [id number]  HPI  #1. Follow diabetes mellitus. She's been unable to purchase the NovoLog. She has been trying to watch her diet much better. #2. Continues to have intermittent problems with yeast infections in the vaginal area. Diflucan wasn't working as well for her the last time she used it. #3. Increased tingling in her feet. This bothers her particularly at night. Review of Systems No unusual weight change, no fever, sweats, chills. No blurred vision.    Objective:   Physical Exam Vital signs are reviewed. GENERAL: Well-developed female no acute distress FEET: Decreased sensation to soft touch on the plantar portion of the foot to the mid foot bilaterally. Otherwise her soft touch sensation is intact. Feet are without any lesion or large callus. Asks her status in the feet is normal. CV: Regular rate and rhythm LUNGS: Clear to auscultation bilaterally       Assessment & Plan:  #1. Diabetes mellitus. Slight improvement. I wanted to increase her NovoLog with that she hasn't been taking any at all. Significant find another source for her #2. Peripheral neuropathy of symptoms early. Discussed absolute need to get control of her A1c #3. Recurrent as recent infections. We'll give her some Diflucan followup 2 months to

## 2013-06-03 NOTE — Patient Instructions (Addendum)
You are making progress with your A1C. Our goal is 7.0.  I think the numbness and tingling in your feet is due to chronic elevated blood sugar. Let's increase your evening meal coverage of the insulin aspartate (novolog) to 20 units at dinner time. Check your sugars before dinner and then right before bed 2-3 times in the next month and send me (or drop off at front desk) the readings and times along with how much insulin aspartate you actually took.  I am increasing the coverage (dose) of the fluconazole. Let's see if this also helps the yeast.  Here are your recent A1C readings Results for Rebecca Terrell, Rebecca Terrell (MRN [id number]) as of 06/03/2013 09:09  Ref. Range 08/27/2012 10:05 02/04/2013 08:56 03/06/2013 12:41 06/03/2013 08:37  Hemoglobin A1C No range found 11.3 12.8  9.7  Glucose Latest Range: 70-99 mg/dL 409 (H)     Preg Test, Ur Latest Range: NEGATIVE    NEGATIVE

## 2013-06-05 LAB — URINE CULTURE: Colony Count: >=100000

## 2013-09-23 ENCOUNTER — Other Ambulatory Visit (HOSPITAL_COMMUNITY)
Admission: RE | Admit: 2013-09-23 | Discharge: 2013-09-23 | Disposition: A | Discharge status: Home / Self Care | Payer: No Typology Code available for payment source | Source: Ambulatory Visit | Attending: Family Medicine | Admitting: Family Medicine

## 2013-09-23 ENCOUNTER — Encounter: Payer: Self-pay | Admitting: Family Medicine

## 2013-09-23 ENCOUNTER — Ambulatory Visit (INDEPENDENT_AMBULATORY_CARE_PROVIDER_SITE_OTHER): Payer: No Typology Code available for payment source | Admitting: Family Medicine

## 2013-09-23 VITALS — BP 108/76 | HR 78 | Temp 99.1°F | Ht 62.5 in | Wt 168.9 lb

## 2013-09-23 DIAGNOSIS — Z0000 Encounter for general adult medical examination without abnormal findings: Secondary | ICD-10-CM

## 2013-09-23 DIAGNOSIS — Z124 Encounter for screening for malignant neoplasm of cervix: Secondary | ICD-10-CM

## 2013-09-23 DIAGNOSIS — Z1151 Encounter for screening for human papillomavirus (HPV): Secondary | ICD-10-CM | POA: Insufficient documentation

## 2013-09-23 DIAGNOSIS — E119 Type 2 diabetes mellitus without complications: Secondary | ICD-10-CM

## 2013-09-23 DIAGNOSIS — E1165 Type 2 diabetes mellitus with hyperglycemia: Secondary | ICD-10-CM

## 2013-09-23 DIAGNOSIS — Z01419 Encounter for gynecological examination (general) (routine) without abnormal findings: Secondary | ICD-10-CM | POA: Insufficient documentation

## 2013-09-23 LAB — POCT GLYCOSYLATED HEMOGLOBIN (HGB A1C): Hemoglobin A1C: 10.1

## 2013-09-23 MED ORDER — LISINOPRIL-HYDROCHLOROTHIAZIDE 20-25 MG PO TABS: 1.0000 | ORAL_TABLET | Freq: Every day | ORAL | Status: DC

## 2013-09-23 MED ORDER — INSULIN ASPART 100 UNIT/ML ~~LOC~~ SOLN: SUBCUTANEOUS | Status: DC

## 2013-09-23 MED ORDER — METFORMIN HCL 1000 MG PO TABS: 1000.0000 mg | ORAL_TABLET | Freq: Two times a day (BID) | ORAL | Status: DC

## 2013-09-23 MED ORDER — INSULIN GLARGINE 100 UNIT/ML ~~LOC~~ SOLN: SUBCUTANEOUS | Status: DC

## 2013-09-23 MED ORDER — FLUCONAZOLE 200 MG PO TABS: ORAL_TABLET | ORAL | Status: DC

## 2013-09-23 MED ORDER — POTASSIUM CHLORIDE ER 10 MEQ PO CPCR: 20.0000 meq | ORAL_CAPSULE | Freq: Every day | ORAL | Status: DC

## 2013-09-23 NOTE — Patient Instructions (Signed)
Please make an appointment to see Dr. Raymondo Band in pharmacy clinic for further work on your diabetes. Let me see you in 3 months. Have a Happy Holiday!

## 2013-09-25 ENCOUNTER — Encounter: Payer: Self-pay | Admitting: Family Medicine

## 2013-09-25 DIAGNOSIS — Z0000 Encounter for general adult medical examination without abnormal findings: Secondary | ICD-10-CM | POA: Insufficient documentation

## 2013-09-25 NOTE — Assessment & Plan Note (Signed)
Uncontrolled diabetes mellitus. We again discussed diet.I amr not sure what to do with her medication regimen to see if that'll make any difference. I'll have her schedule, with pharmacy clinic and see her in 2 months.

## 2013-09-25 NOTE — Assessment & Plan Note (Signed)
Pap today 

## 2013-09-25 NOTE — Progress Notes (Signed)
   Subjective:    Patient ID: Rebecca Terrell, female    DOB: 18-Dec-1970, 42 y.o.   MRN: [id number]  HPI  Follow diabetes mellitus. She thinks her sugars maybe do little bit better. She still not following her diet. #2. Needs her Pap smear today.  Review of Systems No increase in urination frequency, no increase in thirst. No weight change. No vaginal discharge at the time. No pelvic pain. Menses are regular.    Objective:   Physical Exam  Vital signs reviewed GENERALl: Well developed, well nourished, in no acute distress. NECK: Supple, FROM, without lymphadenopathy.  THYROID: normal without nodularity CAROTID ARTERIES: without bruits LUNGS: clear to auscultation bilaterally. No wheezes or rales. HEART: Regular rate and rhythm, no murmurs ABDOMEN: soft with positive bowel sounds MSK: MOE x 4 SKIN no rash NEURO: no focal deficits GU: Externally normal female genitalia. No adnexal masses or tenderness. Normal appearing cervix. Small amount of normal appearing discharge. Pap obtained.       Assessment & Plan:

## 2013-10-06 ENCOUNTER — Encounter: Payer: Self-pay | Admitting: Family Medicine

## 2013-10-12 ENCOUNTER — Ambulatory Visit: Payer: No Typology Code available for payment source

## 2013-10-15 ENCOUNTER — Ambulatory Visit: Payer: Self-pay | Admitting: Pharmacist

## 2013-10-22 ENCOUNTER — Ambulatory Visit: Payer: Self-pay

## 2013-12-23 ENCOUNTER — Other Ambulatory Visit: Payer: Self-pay | Admitting: Family Medicine

## 2013-12-23 ENCOUNTER — Other Ambulatory Visit (INDEPENDENT_AMBULATORY_CARE_PROVIDER_SITE_OTHER): Payer: No Typology Code available for payment source

## 2013-12-23 DIAGNOSIS — E1165 Type 2 diabetes mellitus with hyperglycemia: Secondary | ICD-10-CM

## 2013-12-23 DIAGNOSIS — IMO0002 Reserved for concepts with insufficient information to code with codable children: Secondary | ICD-10-CM

## 2013-12-23 DIAGNOSIS — IMO0001 Reserved for inherently not codable concepts without codable children: Secondary | ICD-10-CM

## 2013-12-23 LAB — POCT GLYCOSYLATED HEMOGLOBIN (HGB A1C): Hemoglobin A1C: 10.2

## 2013-12-23 NOTE — Progress Notes (Signed)
Pt came for A1c = 10.2%   BAJORDAN, MLS

## 2013-12-25 ENCOUNTER — Encounter: Payer: Self-pay | Admitting: Family Medicine

## 2013-12-31 ENCOUNTER — Encounter: Payer: Self-pay | Admitting: Family Medicine

## 2014-01-12 ENCOUNTER — Telehealth: Payer: Self-pay | Admitting: Family Medicine

## 2014-01-12 NOTE — Telephone Encounter (Signed)
Health dept no longer offers insulin patient is on. Alternatives are as Lilly or Humulin. Please send in refill asap. Patient completely out.

## 2014-01-13 MED ORDER — INSULIN NPH ISOPHANE & REGULAR (70-30) 100 UNIT/ML ~~LOC~~ SUSP: SUBCUTANEOUS | Status: DC

## 2014-01-13 NOTE — Telephone Encounter (Signed)
I am sending rx for humulin to [facility]. Please tell her the dose will be 40 units twice a day and CHECK HER BLOOD SUGARS THANKS! Nestor RampSara L Leylah Tarnow

## 2014-01-13 NOTE — Telephone Encounter (Signed)
Tried calling patient x 2, no answer, no voicemail. Will try again later. [facility]

## 2014-01-15 ENCOUNTER — Telehealth: Payer: Self-pay | Admitting: *Deleted

## 2014-01-15 NOTE — Telephone Encounter (Signed)
Received fax from [facility] regarding Diflucan 200 mg tab.  Samples are no longer available and pt can pay $6 copay for Diflucan (Fluconazole) 150 mg if you consider changing.  Would need new Rx if dosage change.  Clovis Puamika L Tagen Brethauer, RN

## 2014-01-18 MED ORDER — FLUCONAZOLE 150 MG PO TABS: ORAL_TABLET | ORAL | Status: DC

## 2014-01-18 NOTE — Telephone Encounter (Signed)
Dear [employer]if[facility] AstersWhite Team Ok--she can get the 150 mg  HANKS! Nestor RampSara L Idamae Coccia

## 2014-01-19 NOTE — Telephone Encounter (Signed)
[facility]

## 2014-02-08 ENCOUNTER — Other Ambulatory Visit: Payer: Self-pay | Admitting: Family Medicine

## 2014-02-08 ENCOUNTER — Telehealth: Payer: Self-pay | Admitting: Family Medicine

## 2014-02-08 DIAGNOSIS — N63 Unspecified lump in unspecified breast: Secondary | ICD-10-CM

## 2014-02-08 NOTE — Telephone Encounter (Signed)
Please advise.Thank you.Rebecca Terrell S Derry Arbogast  

## 2014-02-08 NOTE — Telephone Encounter (Signed)
Pt needs referral to [facility] Was suppose to go every 6 months but hasnt  Been since 2013 Please advise  Please call Delia [phone]- pts friend

## 2014-02-10 NOTE — Telephone Encounter (Signed)
left message on voicemail that was given#[phone].Rebecca Terrell

## 2014-02-10 NOTE — Telephone Encounter (Signed)
Looks like she had one scheduled on the 4th? I am confused. She does not need a referral THANKS! Nestor RampSara L Aprel Egelhoff

## 2014-02-15 ENCOUNTER — Other Ambulatory Visit: Payer: Self-pay | Admitting: Family Medicine

## 2014-02-15 ENCOUNTER — Other Ambulatory Visit: Payer: Self-pay

## 2014-02-15 DIAGNOSIS — N63 Unspecified lump in unspecified breast: Secondary | ICD-10-CM

## 2014-02-17 ENCOUNTER — Ambulatory Visit
Admission: RE | Admit: 2014-02-17 | Discharge: 2014-02-17 | Disposition: A | Discharge status: Home / Self Care | Payer: No Typology Code available for payment source | Source: Ambulatory Visit | Attending: Family Medicine | Admitting: Family Medicine

## 2014-02-17 DIAGNOSIS — N63 Unspecified lump in unspecified breast: Secondary | ICD-10-CM

## 2014-03-16 ENCOUNTER — Telehealth: Payer: Self-pay | Admitting: *Deleted

## 2014-03-16 NOTE — Telephone Encounter (Signed)
Delia came in today stating that patient's Humulin 70/30 is making the patient sick and nausea. Can this be changed to something else?

## 2014-03-16 NOTE — Telephone Encounter (Signed)
Nothing that is as low cost so the lantus is much more expensive. I wonder if her really high sugars is what is making her sick---doubt it is the insulin THANKS! Nestor Ramp

## 2014-03-18 NOTE — Telephone Encounter (Signed)
Spoke with Delia and informed her of below. She will follow up with patient and I informed her to look at what the patient's  sugar readings have been.

## 2014-04-19 ENCOUNTER — Ambulatory Visit: Payer: No Typology Code available for payment source

## 2014-05-13 ENCOUNTER — Other Ambulatory Visit: Payer: Self-pay | Admitting: *Deleted

## 2014-05-14 MED ORDER — METFORMIN HCL 1000 MG PO TABS: 1000.0000 mg | ORAL_TABLET | Freq: Two times a day (BID) | ORAL | Status: DC

## 2014-05-17 ENCOUNTER — Telehealth: Payer: Self-pay | Admitting: *Deleted

## 2014-05-17 DIAGNOSIS — E1165 Type 2 diabetes mellitus with hyperglycemia: Principal | ICD-10-CM

## 2014-05-17 DIAGNOSIS — IMO0001 Reserved for inherently not codable concepts without codable children: Secondary | ICD-10-CM

## 2014-05-17 MED ORDER — METFORMIN HCL 1000 MG PO TABS: 1000.0000 mg | ORAL_TABLET | Freq: Two times a day (BID) | ORAL | Status: DC

## 2014-05-17 NOTE — Telephone Encounter (Signed)
Received another fax refill request for pt's metformin HCL 1000 mg.  Medication refilled on 05/14/2014; stated print.  Medication resent to [facility].  Clovis PuMartin, Tamika L, RN

## 2014-05-26 ENCOUNTER — Encounter: Payer: No Typology Code available for payment source | Admitting: Family Medicine

## 2014-06-23 ENCOUNTER — Encounter: Payer: Self-pay | Admitting: Family Medicine

## 2014-06-23 ENCOUNTER — Ambulatory Visit (INDEPENDENT_AMBULATORY_CARE_PROVIDER_SITE_OTHER): Payer: No Typology Code available for payment source | Admitting: Family Medicine

## 2014-06-23 VITALS — BP 122/77 | HR 70 | Temp 98.3°F | Ht 63.0 in | Wt 165.8 lb

## 2014-06-23 DIAGNOSIS — IMO0002 Reserved for concepts with insufficient information to code with codable children: Secondary | ICD-10-CM

## 2014-06-23 DIAGNOSIS — IMO0001 Reserved for inherently not codable concepts without codable children: Secondary | ICD-10-CM

## 2014-06-23 DIAGNOSIS — L03319 Cellulitis of trunk, unspecified: Secondary | ICD-10-CM

## 2014-06-23 DIAGNOSIS — E1165 Type 2 diabetes mellitus with hyperglycemia: Secondary | ICD-10-CM

## 2014-06-23 DIAGNOSIS — L02219 Cutaneous abscess of trunk, unspecified: Secondary | ICD-10-CM

## 2014-06-23 DIAGNOSIS — L02213 Cutaneous abscess of chest wall: Secondary | ICD-10-CM

## 2014-06-23 LAB — POCT GLYCOSYLATED HEMOGLOBIN (HGB A1C): HEMOGLOBIN A1C: 11.1

## 2014-06-23 MED ORDER — HYDROCODONE-ACETAMINOPHEN 5-325 MG PO TABS: 1.0000 | ORAL_TABLET | Freq: Four times a day (QID) | ORAL | Status: DC | PRN

## 2014-06-23 MED ORDER — SULFAMETHOXAZOLE-TMP DS 800-160 MG PO TABS: 1.0000 | ORAL_TABLET | Freq: Two times a day (BID) | ORAL | Status: DC

## 2014-06-25 ENCOUNTER — Encounter: Payer: Self-pay | Admitting: Family Medicine

## 2014-06-25 NOTE — Progress Notes (Signed)
   Subjective:    Patient ID: Rebecca Terrell, female    DOB: Aug 09, 1971, 43 y.o.   MRN: [id number]  HPI Plan infected boil on her right breast area. Spin therefore to 5 days and is getting bigger and more sore. No discharge from the area. Never had problem with skin boils before.  Review of Systems . She's had no fever, sweats, chills.     Objective:   Physical Exam  Vital signs are reviewed GENERAL: Well-developed female no acute distress SKIN: Area under the axilla next of the right breast reveals a 4 cm in length and 1 cm in diameter cystic structure that is red and warm and fluctuant. There is no surrounding erythema.  PROCEDURE NOTE: Patient given informed consent for incision and drainage. Signed copy is in the chart. Area prepped and draped in usual sterile fashion. 2 cc of lidocaine 1% without epinephrine used for local anesthesia. After anesthesia was obtained, a still 11 blade scalpel to incise a very small incision into the center of the abscess. Copious amounts of pus were then expressed. I used curved forceps to check for him break up several small loculated areas. Then I placed 5-1/2 cm of cause into the cyst leaving a small portion hanging out. Over this we placed a nonstick pad and a pressure dressing. I gave her post procedure instructions and red flags to watch out for such as increased pain, increased redness, fever.      Assessment & Plan:  Skin abscess. Incision and drainage as above. I'll place her on antibiotics especially given her poorly controlled underlying diabetes mellitus. Gave her instructions on removing a portion of the wick over the next 3 days and went care. We'll see her back in the office next week for followup. She'll call in interim with problems.

## 2014-06-28 ENCOUNTER — Encounter: Payer: Self-pay | Admitting: Family Medicine

## 2014-06-28 ENCOUNTER — Ambulatory Visit (INDEPENDENT_AMBULATORY_CARE_PROVIDER_SITE_OTHER): Payer: No Typology Code available for payment source | Admitting: Family Medicine

## 2014-06-28 VITALS — BP 134/86 | HR 77 | Temp 98.3°F | Ht 62.0 in | Wt 167.5 lb

## 2014-06-28 DIAGNOSIS — Z23 Encounter for immunization: Secondary | ICD-10-CM

## 2014-06-28 DIAGNOSIS — L0291 Cutaneous abscess, unspecified: Secondary | ICD-10-CM | POA: Insufficient documentation

## 2014-06-28 DIAGNOSIS — L03818 Cellulitis of other sites: Secondary | ICD-10-CM

## 2014-06-28 DIAGNOSIS — L02818 Cutaneous abscess of other sites: Secondary | ICD-10-CM

## 2014-06-28 NOTE — Progress Notes (Signed)
   Subjective:    Patient ID: Rebecca Terrell, female    DOB: 12/15/1970, 43 y.o.   MRN: [id number]  HPI 43 y/o female presents for wound check, she underwent I&D right breast abscess on 06/23/14, the area is less red and painful, the packing has fallen out, she continues to have a small amount of drainage from the area, taking Bactrim daily as prescribed, no fevers/chills   Review of Systems  Constitutional: Negative for fever and chills.       Objective:   Physical Exam Vitals: reviewed Skin: right breast (chaperone present), 3 cm by 1 cm area of induration, some erythema present, no active drainage, no fluctuance present, tender to palpation     Assessment & Plan:  Please see problem specific assessment and plan.

## 2014-06-28 NOTE — Patient Instructions (Signed)
Breast Infection - the area appears to be healing, continue the antibiotics until completed, apply warm compresses daily, a small amount of drainage is normal for the next few day, return if the area becomes more red/irriatated or you start to have fevers/chills.

## 2014-06-28 NOTE — Assessment & Plan Note (Signed)
Patient presents for follow up of I&D right breast. Affected area is smaller in size. Appears to be reacting to treatment. No need for further I&D. -complete course of Bactrim -no need to return for follow up unless symptoms worsen, return precautions given

## 2014-09-20 ENCOUNTER — Ambulatory Visit: Payer: Self-pay

## 2014-10-20 ENCOUNTER — Ambulatory Visit (INDEPENDENT_AMBULATORY_CARE_PROVIDER_SITE_OTHER): Payer: Self-pay | Admitting: Family Medicine

## 2014-10-20 ENCOUNTER — Encounter: Payer: Self-pay | Admitting: Family Medicine

## 2014-10-20 VITALS — BP 139/89 | HR 87 | Temp 98.7°F | Ht 62.0 in | Wt 163.5 lb

## 2014-10-20 DIAGNOSIS — F4321 Adjustment disorder with depressed mood: Secondary | ICD-10-CM

## 2014-10-20 DIAGNOSIS — IMO0002 Reserved for concepts with insufficient information to code with codable children: Secondary | ICD-10-CM

## 2014-10-20 DIAGNOSIS — E118 Type 2 diabetes mellitus with unspecified complications: Secondary | ICD-10-CM

## 2014-10-20 DIAGNOSIS — E1165 Type 2 diabetes mellitus with hyperglycemia: Secondary | ICD-10-CM

## 2014-10-20 LAB — POCT GLYCOSYLATED HEMOGLOBIN (HGB A1C): Hemoglobin A1C: 11.2

## 2014-10-20 MED ORDER — INSULIN NPH ISOPHANE & REGULAR (70-30) 100 UNIT/ML ~~LOC~~ SUSP: SUBCUTANEOUS | Status: DC

## 2014-10-20 MED ORDER — METFORMIN HCL 1000 MG PO TABS: 1000.0000 mg | ORAL_TABLET | Freq: Two times a day (BID) | ORAL | Status: DC

## 2014-10-20 MED ORDER — FLUCONAZOLE 150 MG PO TABS: ORAL_TABLET | ORAL | Status: DC

## 2014-10-20 MED ORDER — LISINOPRIL-HYDROCHLOROTHIAZIDE 20-25 MG PO TABS: 1.0000 | ORAL_TABLET | Freq: Every day | ORAL | Status: DC

## 2014-10-20 MED ORDER — POTASSIUM CHLORIDE ER 10 MEQ PO CPCR: 20.0000 meq | ORAL_CAPSULE | Freq: Every day | ORAL | Status: DC

## 2014-10-20 MED ORDER — FLUOXETINE HCL 20 MG PO TABS: 20.0000 mg | ORAL_TABLET | Freq: Every day | ORAL | Status: DC

## 2014-10-21 DIAGNOSIS — F4320 Adjustment disorder, unspecified: Secondary | ICD-10-CM | POA: Insufficient documentation

## 2014-10-21 DIAGNOSIS — F4321 Adjustment disorder with depressed mood: Secondary | ICD-10-CM | POA: Insufficient documentation

## 2014-10-21 NOTE — Progress Notes (Signed)
   Subjective:    Patient ID: Rebecca Terrell, female    DOB: 1971-09-13, 44 y.o.   MRN: [id number]  HPI #1. Diabetes mellitus follow-up. Unfortunately she has had some family stressors and has been unable to really modify her diet and exercise. She's not been checking her blood sugars regularly. She's had no episodes of low blood sugar. She's had no unusual weight loss, no frequency of urination and no excessive thirst. #2. Grief reaction. Her mother died a couple of weeks ago. Unfortunately her mom still lived in [place] and she is not been able to go back for the funeral. She's having a lot of anxiety, difficulty falling asleep and can usually only sleep 20-30 minutes at a time. No suicidal or homicidal ideation. Has not had problems with this, of anxiety before. Continues to work full-time.   Review of Systems See history of present illness. Additional pertinent review of systems includes no fever, sweats, chills. Positive for tearfulness, insomnia and anxiety. Denies overt depression but admits to some anhedonia.    Objective:   Physical Exam  Vital signs reviewed. GENERAL: Well-developed, well-nourished, no acute distress. CARDIOVASCULAR: Regular rate and rhythm no murmur gallop or rub LUNGS: Clear to auscultation bilaterally, no rales or wheeze. ABDOMEN: Soft positive bowel sounds NEURO: No gross focal neurological deficits. MSK: Movement of extremity x 4. PSYCHIATRIC: Affect is little bit tearful. Good eye contact. Interactive. Denies hallucination. Speech is normal in content although I am conducting the interview with her personal interpreter which always comes to her visits. Speech is normal and fluency.      Assessment & Plan:

## 2014-10-21 NOTE — Assessment & Plan Note (Signed)
I think her grief reaction is probably complicated by what I have long thought is some underlying mild to moderate depression. We discussed today and she agrees to start medication for the next period of 2-3 months see if we can improve her anxiety, help her sleep so she continued perform work full-time. I'll start her on fluoxetine. I'll see her back in 4-6 weeks.

## 2014-10-21 NOTE — Assessment & Plan Note (Signed)
A1c today was same as last office visit. Given the other things going on right now ill exam going to do is increase her insulin by 5 units twice a day and see her back in 4-6 weeks.

## 2014-12-01 ENCOUNTER — Ambulatory Visit: Payer: Self-pay | Admitting: Family Medicine

## 2015-02-23 ENCOUNTER — Ambulatory Visit (INDEPENDENT_AMBULATORY_CARE_PROVIDER_SITE_OTHER): Payer: Self-pay | Admitting: Family Medicine

## 2015-02-23 ENCOUNTER — Telehealth: Payer: Self-pay | Admitting: *Deleted

## 2015-02-23 ENCOUNTER — Encounter: Payer: Self-pay | Admitting: Family Medicine

## 2015-02-23 VITALS — BP 108/83 | HR 77 | Temp 98.3°F | Ht 62.0 in | Wt 161.0 lb

## 2015-02-23 DIAGNOSIS — I10 Essential (primary) hypertension: Secondary | ICD-10-CM

## 2015-02-23 DIAGNOSIS — F4321 Adjustment disorder with depressed mood: Secondary | ICD-10-CM

## 2015-02-23 DIAGNOSIS — E1165 Type 2 diabetes mellitus with hyperglycemia: Secondary | ICD-10-CM

## 2015-02-23 DIAGNOSIS — IMO0002 Reserved for concepts with insufficient information to code with codable children: Secondary | ICD-10-CM

## 2015-02-23 LAB — BASIC METABOLIC PANEL
BUN: 12 mg/dL (ref 6–23)
CALCIUM: 9.3 mg/dL (ref 8.4–10.5)
CHLORIDE: 101 meq/L (ref 96–112)
CO2: 25 meq/L (ref 19–32)
Creat: 0.51 mg/dL (ref 0.50–1.10)
GLUCOSE: 237 mg/dL — AB (ref 70–99)
POTASSIUM: 3.5 meq/L (ref 3.5–5.3)
Sodium: 135 mEq/L (ref 135–145)

## 2015-02-23 LAB — POCT GLYCOSYLATED HEMOGLOBIN (HGB A1C): Hemoglobin A1C: 10.3

## 2015-02-23 MED ORDER — FLUOXETINE HCL 20 MG PO TABS: 20.0000 mg | ORAL_TABLET | Freq: Every day | ORAL | Status: DC

## 2015-02-23 MED ORDER — PERMETHRIN 5 % EX CREA: 1.0000 "application " | TOPICAL_CREAM | Freq: Once | CUTANEOUS | Status: DC

## 2015-02-23 MED ORDER — INSULIN NPH ISOPHANE & REGULAR (70-30) 100 UNIT/ML ~~LOC~~ SUSP: SUBCUTANEOUS | Status: DC
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
## 2015-02-23 NOTE — Assessment & Plan Note (Signed)
Long discussion with her regarding stress, sleep, grief reaction. She agrees to restart fluoxetine. I gave her brand-new prescription.

## 2015-02-23 NOTE — Assessment & Plan Note (Signed)
Not sure what's going on with her insulin. I gave her a new prescription today. We'll get her A1c today.

## 2015-02-23 NOTE — Telephone Encounter (Signed)
[facility] stating Prozac capsule was on the $4 list and request to change.  Verbal order given to change from Prozac tablet to capsule due to cost.  Will forward to PCP.  Clovis PuMartin, Tamika L, RN

## 2015-02-25 ENCOUNTER — Encounter: Payer: Self-pay | Admitting: Family Medicine

## 2015-03-02 ENCOUNTER — Other Ambulatory Visit: Payer: Self-pay

## 2015-03-02 DIAGNOSIS — Z1231 Encounter for screening mammogram for malignant neoplasm of breast: Secondary | ICD-10-CM

## 2015-03-11 ENCOUNTER — Ambulatory Visit
Admission: RE | Admit: 2015-03-11 | Discharge: 2015-03-11 | Disposition: A | Discharge status: Home / Self Care | Payer: No Typology Code available for payment source | Source: Ambulatory Visit

## 2015-03-11 DIAGNOSIS — Z1231 Encounter for screening mammogram for malignant neoplasm of breast: Secondary | ICD-10-CM

## 2015-03-15 ENCOUNTER — Ambulatory Visit: Payer: Self-pay

## 2015-03-28 ENCOUNTER — Ambulatory Visit: Payer: Self-pay

## 2015-03-30 ENCOUNTER — Ambulatory Visit (INDEPENDENT_AMBULATORY_CARE_PROVIDER_SITE_OTHER): Payer: Self-pay | Admitting: Family Medicine

## 2015-03-30 ENCOUNTER — Encounter: Payer: Self-pay | Admitting: Family Medicine

## 2015-03-30 ENCOUNTER — Ambulatory Visit: Payer: Self-pay | Admitting: Family Medicine

## 2015-03-30 VITALS — BP 134/85 | HR 87 | Temp 98.3°F | Ht 61.0 in | Wt 165.1 lb

## 2015-03-30 DIAGNOSIS — Z79899 Other long term (current) drug therapy: Secondary | ICD-10-CM

## 2015-03-30 DIAGNOSIS — M79675 Pain in left toe(s): Secondary | ICD-10-CM | POA: Insufficient documentation

## 2015-03-30 DIAGNOSIS — B351 Tinea unguium: Secondary | ICD-10-CM

## 2015-03-30 LAB — COMPREHENSIVE METABOLIC PANEL
ALT: 13 U/L (ref 0–35)
AST: 13 U/L (ref 0–37)
Albumin: 4 g/dL (ref 3.5–5.2)
Alkaline Phosphatase: 70 U/L (ref 39–117)
BILIRUBIN TOTAL: 0.3 mg/dL (ref 0.2–1.2)
BUN: 15 mg/dL (ref 6–23)
CO2: 22 mEq/L (ref 19–32)
Calcium: 9.7 mg/dL (ref 8.4–10.5)
Chloride: 103 mEq/L (ref 96–112)
Creat: 0.47 mg/dL — ABNORMAL LOW (ref 0.50–1.10)
GLUCOSE: 88 mg/dL (ref 70–99)
Potassium: 3.4 mEq/L — ABNORMAL LOW (ref 3.5–5.3)
Sodium: 138 mEq/L (ref 135–145)
Total Protein: 7.2 g/dL (ref 6.0–8.3)

## 2015-03-30 MED ORDER — TRAMADOL HCL 50 MG PO TABS: 50.0000 mg | ORAL_TABLET | Freq: Three times a day (TID) | ORAL | Status: DC | PRN

## 2015-03-30 MED ORDER — TERBINAFINE HCL 250 MG PO TABS: 250.0000 mg | ORAL_TABLET | Freq: Every day | ORAL | Status: DC

## 2015-03-30 NOTE — Progress Notes (Signed)
   Subjective:    Patient ID: Rebecca Terrell, female    DOB: 1971/05/06, 44 y.o.   MRN: [id number]  HPI  Patient presents for Same Day Appointment  CC: toes hurt  # Toe pain:  Kicked bed on Saturday (states initially hit pinky toe, however on further discussion she kicked it moving forward so all toes hit)  Has had pain since that time. 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 All other systems reviewed and are negative.  Past medical history, surgical, family, and social history reviewed and updated in the EMR as appropriate.  Objective:  BP 134/85 mmHg  Pulse 87  Temp(Src) 98.3 F (36.8 C) (Oral)  Ht 5\' 1"  (1.549 m)  Wt 165 lb 2 oz (74.9 kg)  BMI 31.22 kg/m2  LMP 03/24/2015 Vitals and nursing note reviewed  General: NAD Ext: Tender to palpation MTP joints 3rd-5th digits on left foot, mild amount of swelling. Able to move all toes without issue. No tenderness over 5th proximal metatarsal. Left great toe thickened and yellow toenail consistent with onychomycosis Skin: ecchymosis located between 5th, 4th, 3rd digits on left foot Neuro: able to ambulate on own without assistance  Assessment & Plan:  See Problem List Documentation

## 2015-03-30 NOTE — Assessment & Plan Note (Signed)
Check cmet (had bmet in May), start terbafine x 3 months. Repeat cmet in 6 weeks. Otherwise f/u as needed.
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
## 2015-03-30 NOTE — Patient Instructions (Addendum)
Call if your foot pain is not getting better in the next few weeks, you may need x-rays to see if there is a fracture. Elevate the foot when not standing Ice the foot at least a few times a day (for at least 15 minutes)   Toenails: we need to check your liver enzymes today and re-check in 6 weeks. Take the terbinafine 250mg  once a day for 3 months.

## 2015-05-04 ENCOUNTER — Ambulatory Visit: Payer: Self-pay | Admitting: Family Medicine

## 2015-05-18 ENCOUNTER — Ambulatory Visit: Payer: Self-pay | Admitting: Family Medicine

## 2015-06-29 ENCOUNTER — Ambulatory Visit: Payer: Self-pay | Admitting: Family Medicine

## 2015-07-27 ENCOUNTER — Ambulatory Visit (INDEPENDENT_AMBULATORY_CARE_PROVIDER_SITE_OTHER): Payer: Self-pay | Admitting: Family Medicine

## 2015-07-27 ENCOUNTER — Encounter: Payer: Self-pay | Admitting: Family Medicine

## 2015-07-27 VITALS — BP 128/86 | HR 68 | Ht 61.0 in | Wt 164.0 lb

## 2015-07-27 DIAGNOSIS — Z794 Long term (current) use of insulin: Secondary | ICD-10-CM

## 2015-07-27 DIAGNOSIS — E118 Type 2 diabetes mellitus with unspecified complications: Secondary | ICD-10-CM

## 2015-07-27 DIAGNOSIS — Z23 Encounter for immunization: Secondary | ICD-10-CM

## 2015-07-27 DIAGNOSIS — E1165 Type 2 diabetes mellitus with hyperglycemia: Secondary | ICD-10-CM

## 2015-07-27 LAB — COMPREHENSIVE METABOLIC PANEL
ALK PHOS: 85 U/L (ref 33–115)
ALT: 15 U/L (ref 6–29)
AST: 12 U/L (ref 10–35)
Albumin: 4.1 g/dL (ref 3.6–5.1)
BUN: 14 mg/dL (ref 7–25)
CALCIUM: 9.4 mg/dL (ref 8.6–10.2)
CO2: 24 mmol/L (ref 20–31)
Chloride: 99 mmol/L (ref 98–110)
Creat: 0.53 mg/dL (ref 0.50–1.10)
Glucose, Bld: 220 mg/dL — ABNORMAL HIGH (ref 65–99)
Potassium: 3.4 mmol/L — ABNORMAL LOW (ref 3.5–5.3)
Sodium: 134 mmol/L — ABNORMAL LOW (ref 135–146)
TOTAL PROTEIN: 7.8 g/dL (ref 6.1–8.1)
Total Bilirubin: 0.5 mg/dL (ref 0.2–1.2)

## 2015-07-27 LAB — POCT GLYCOSYLATED HEMOGLOBIN (HGB A1C): HEMOGLOBIN A1C: 10.5

## 2015-07-27 MED ORDER — METFORMIN HCL 1000 MG PO TABS
1000.0000 mg | ORAL_TABLET | Freq: Two times a day (BID) | ORAL | Status: DC
Start: 2015-07-27 — End: 2015-12-15

## 2015-07-27 MED ORDER — INSULIN NPH ISOPHANE & REGULAR (70-30) 100 UNIT/ML ~~LOC~~ SUSP: SUBCUTANEOUS | Status: DC

## 2015-07-27 MED ORDER — LISINOPRIL-HYDROCHLOROTHIAZIDE 20-25 MG PO TABS
1.0000 | ORAL_TABLET | Freq: Every day | ORAL | Status: DC
Start: 2015-07-27 — End: 2015-12-14

## 2015-07-27 MED ORDER — POTASSIUM CHLORIDE ER 10 MEQ PO CPCR
20.0000 meq | ORAL_CAPSULE | Freq: Every day | ORAL | Status: DC
Start: 2015-07-27 — End: 2016-08-15

## 2015-07-28 NOTE — Progress Notes (Signed)
   Subjective:    Patient ID: Rebecca Terrell, female    DOB: 04-Aug-1971, 44 y.o.   MRN: [id number]  HPI Visit conducted with use of interpreter Cecelia She is here for follow-up diabetes mellitus. Has some complain of frequent headaches as well. She's not following any kind of dietary restrictions, pretty much eating what she wants. Not checking her blood sugars at all. No increased frequency of urination, no episodes of symptomatic low blood sugar. #2. Having some numbness and tingling in her hand, particularly the pinky and ring finger on the right hand. Intermittent.   Review of Systems See history of present illness additional pertinent review of systems negative for numbness or tingling in her feet or her left hand. Paresthesias seem isolated to the fourth and fifth finger on her right hand. No unusual weight change fever, sweats, chills. No visual changes.    Objective:   Physical Exam  Vital signs reviewed. GENERAL: Well-developed, well-nourished, no acute distress. CARDIOVASCULAR: Regular rate and rhythm no murmur gallop or rub LUNGS: Clear to auscultation bilaterally, no rales or wheeze. ABDOMEN: Soft positive bowel sounds NEURO: No gross focal neurological deficits. MSK: Movement of extremity x 4. No stiffness are triggering of the fourth and fifth fingers of her right hand. No joint swelling, no erythema or warmth.        Assessment & Plan:

## 2015-07-28 NOTE — Assessment & Plan Note (Signed)
Long discussion today about her continued poor diabetic control. We spent greater than 50% of our 25 minute office visit in counseling education regarding management of diabetes, consequences of poor management, dietary changes and exercise changes that she could make. I'll follow her up in 2 months rhythm 3 to see if we can get some improvement. I've also asked her to bring some blood sugar readings. On my review of her medicine she was only taking 45 units twice daily of her insulin so I'll increase that to 55 units twice daily which is what I thought she was supposed to be taking anyway. We'll continue the metformin. I gave her refills today.

## 2015-07-29 ENCOUNTER — Encounter: Payer: Self-pay | Admitting: Family Medicine

## 2015-08-09 ENCOUNTER — Ambulatory Visit: Payer: Self-pay

## 2015-09-12 ENCOUNTER — Ambulatory Visit (INDEPENDENT_AMBULATORY_CARE_PROVIDER_SITE_OTHER): Payer: Self-pay | Admitting: Family Medicine

## 2015-09-12 ENCOUNTER — Encounter: Payer: Self-pay | Admitting: Family Medicine

## 2015-09-12 ENCOUNTER — Telehealth: Payer: Self-pay | Admitting: Family Medicine

## 2015-09-12 VITALS — BP 155/90 | HR 82 | Temp 98.7°F | Ht 61.0 in | Wt 163.0 lb

## 2015-09-12 DIAGNOSIS — K047 Periapical abscess without sinus: Secondary | ICD-10-CM

## 2015-09-12 MED ORDER — AMOXICILLIN 875 MG PO TABS: 875.0000 mg | ORAL_TABLET | Freq: Two times a day (BID) | ORAL | Status: DC

## 2015-09-12 MED ORDER — OXYCODONE-ACETAMINOPHEN 5-325 MG PO TABS: 1.0000 | ORAL_TABLET | Freq: Four times a day (QID) | ORAL | Status: DC | PRN

## 2015-09-12 MED ORDER — CLINDAMYCIN HCL 300 MG PO CAPS
300.0000 mg | ORAL_CAPSULE | Freq: Three times a day (TID) | ORAL | Status: DC
Start: 2015-09-12 — End: 2015-11-01

## 2015-09-12 NOTE — Assessment & Plan Note (Signed)
History and exam consistent with dental abscess Prescribed clindamycin and Percocet for pain control Attempting to renew orange card with office staff Dental referral placed Would like patient to be seen by dentist as soon as possible Return precautions given

## 2015-09-12 NOTE — Telephone Encounter (Signed)
Pt called because the medication that was prescribed today is not on the [facility]. Can we call something that would be on that list. jw

## 2015-09-12 NOTE — Telephone Encounter (Signed)
Sent new Rx for amoxicillin because patient unable to afford Clindamycin.  Not as good of coverage, but best that I can do for $4.  Patient needs to see dentist asap.  Erasmo DownerAngela M Malli Falotico, MD, MPH PGY-2,  [facility] 09/12/2015 12:22 PM

## 2015-09-12 NOTE — Patient Instructions (Signed)
Nice to meet you today. Please take clindamycin 3 times daily for the next 10 days. You can use Percocet 6 hours as needed for pain. Someone will call you about renewing her orange card as soon as possible and then we will get you into see a dentist  Take care, Dr. BLeonard Schwartz

## 2015-09-12 NOTE — Progress Notes (Signed)
   Subjective:   Jozy Osornio-Lujan is a 44 y.o. female with a history of T2 DM, HTN, obesity here for same day appointment for dental pain.  Patient reports dental pain and right upper side for last 3 days. She reports pain is really bad and Tylenol.helping. She has had a dental abscess before and this feels like it. She doesn't think there is any drainage from around her teeth. She reports she can get an appointment with her dentist because her orange card expired recently.  She denies any fever.  Review of Systems:  Per HPI.   PMH, PSH, Medications, Allergies, and FmHx reviewed and updated in EMR.  Social History: never smoker  Objective:  BP 155/90 mmHg  Pulse 82  Temp(Src) 98.7 F (37.1 C) (Oral)  Ht 5\' 1"  (1.549 m)  Wt 163 lb (73.936 kg)  BMI 30.81 kg/m2  Gen:  44 y.o. female in NAD HEENT: NCAT, MMM, EOMI, PERRL, anicteric sclerae, OP clear, 0.5cm open area on inside of gums adjacent to R upper molars, 1 cm area of fluctuance surrounding open area CV: RRR, no MRG Resp: Non-labored, CTAB, no wheezes noted Ext: WWP, no edema MSK: No obvious deformity Neuro: Alert and oriented, speech normal   Assessment & Plan:     Thanvi Osornio-Lujan is a 44 y.o. female here for tooth pain  Dental abscess History and exam consistent with dental abscess Prescribed clindamycin and Percocet for pain control Attempting to renew orange card with office staff Dental referral placed Would like patient to be seen by dentist as soon as possible Return precautions given    Erasmo DownerAngela M Bashir Marchetti, MD MPH PGY-2,  [facility] 09/12/2015  11:52 AM

## 2015-09-15 ENCOUNTER — Ambulatory Visit: Payer: Self-pay

## 2015-10-24 ENCOUNTER — Ambulatory Visit (INDEPENDENT_AMBULATORY_CARE_PROVIDER_SITE_OTHER): Payer: Self-pay | Admitting: Family Medicine

## 2015-10-24 ENCOUNTER — Encounter: Payer: Self-pay | Admitting: Family Medicine

## 2015-10-24 VITALS — BP 130/85 | HR 89 | Temp 98.9°F | Wt 158.2 lb

## 2015-10-24 DIAGNOSIS — J069 Acute upper respiratory infection, unspecified: Secondary | ICD-10-CM

## 2015-10-24 MED ORDER — AMOXICILLIN 500 MG PO CAPS: 500.0000 mg | ORAL_CAPSULE | Freq: Three times a day (TID) | ORAL | Status: DC

## 2015-10-24 MED ORDER — BENZONATATE 100 MG PO CAPS: 100.0000 mg | ORAL_CAPSULE | Freq: Two times a day (BID) | ORAL | Status: DC | PRN

## 2015-10-24 NOTE — Assessment & Plan Note (Signed)
Patient is here with signs and symptoms most consistent with upper respiratory infection. Symptoms have been ongoing for the past 2 weeks without improvement. -  Amoxicillin ( patient has asked for medications from the $4 [facility] list) -  Tessalon when necessary -  Ibuprofen/Tylenol as needed for fever or discomfort -  Strongly encouraged pushing fluids.

## 2015-10-24 NOTE — Patient Instructions (Signed)
It was a pleasure seeing you today in our clinic. Today we discussed your cough. Here is the treatment plan we have discussed and agreed upon together:   -  I have prescribed you amoxicillin 500 mg 3 times a day for the next 5 days. Take this medication until the bottle is empty. -  I've prescribed Tessalon Perles. This medication will help suppress the cough your currently experiencing. Take this medication only as needed for these symptoms. -  Take ibuprofen/ Tylenol for fevers or discomfort. -  Make sure to stay well-hydrated with water or Gatorade.  With a goal to have nearly clear urine throughout the day.

## 2015-10-24 NOTE — Progress Notes (Signed)
COUGH Cough, congestion, rhinorrhea, fever, ST. All started on new years. Productive cough. Last fever was Friday. HA.  Has been coughing for 2week. Cough is: productive Sputum production: yes Medications tried: ibuprofen, robitussin  Taking blood pressure medications: yes  Symptoms Runny nose: yes Mucous in back of throat: yes Throat burning or reflux: yes Wheezing or asthma: yes Fever: yes Chest Pain: yes, with coughing Shortness of breath: yes Leg swelling: no Hemoptysis: no Weight loss: no  ROS see HPI Smoking Status noted  Objective: BP 130/85 mmHg  Pulse 89  Temp(Src) 98.9 F (37.2 C) (Oral)  Wt 158 lb 3.2 oz (71.759 kg) Gen: NAD, alert, cooperative HEENT: NCAT, EOMI, PERRL, OP erythematous without exudate, Nasal mucosa edematous, TMs clear,  No LAD CV: RRR, no murmur Resp: CTAB, no wheezes, non-labored   Assessment and plan:  URI (upper respiratory infection)  Patient is here with signs and symptoms most consistent with upper respiratory infection. Symptoms have been ongoing for the past 2 weeks without improvement. -  Amoxicillin ( patient has asked for medications from the $4 [facility] list) -  Tessalon when necessary -  Ibuprofen/Tylenol as needed for fever or discomfort -  Strongly encouraged pushing fluids.    Meds ordered this encounter  Medications  . DISCONTD: amoxicillin (AMOXIL) 500 MG capsule    Sig: Take 1 capsule (500 mg total) by mouth 3 (three) times daily.    Dispense:  15 capsule    Refill:  0  . amoxicillin (AMOXIL) 500 MG capsule    Sig: Take 1 capsule (500 mg total) by mouth 3 (three) times daily.    Dispense:  15 capsule    Refill:  0  . benzonatate (TESSALON) 100 MG capsule    Sig: Take 1 capsule (100 mg total) by mouth 2 (two) times daily as needed for cough.    Dispense:  20 capsule    Refill:  0     Kathee DeltonIan D Decarlo Rivet, MD,MS,  PGY2 10/24/2015 5:50 PM

## 2015-11-01 ENCOUNTER — Other Ambulatory Visit: Payer: Self-pay | Admitting: Family Medicine

## 2015-11-01 ENCOUNTER — Encounter: Payer: Self-pay | Admitting: Family Medicine

## 2015-11-01 MED ORDER — INSULIN GLARGINE 100 UNIT/ML ~~LOC~~ SOLN: SUBCUTANEOUS | Status: DC

## 2015-11-01 NOTE — Progress Notes (Signed)
Formulary change for insulin Currently 70/30 with inject 55 u BID =110 units of the 70/30 mix daily Converts to 77 units NPH daily Reduce by 20% = 52 u lantus DAILY  In the past we have had her up to as high as 75 units of lantus, and she is only taking 14-25 units of novolog at supper (supposedly). So, I will order 70 units of lantus daily and continue the novolog at dinner.   I want her to follow up with me WITH SOME BLOOD SUGAR readings in 6 weeks. We may have to adjust. Her last A1C was 10.5 rx sent to [facility] and am sending a copy of this note so they can tell her this as well. I am also putting this in a letter.

## 2015-11-02 ENCOUNTER — Telehealth: Payer: Self-pay | Admitting: *Deleted

## 2015-11-02 NOTE — Telephone Encounter (Signed)
Received call from Crystal @ [facility].  She is aware that pt is supposed to be on novalog but she highly doubts that she is because she is no longer getting this free of charge from [facility] (no longer available).  She request that we change this to apidra as this is one that she can get free.  Spoke with Dr. Neal she will cJennette Kettlensult with Dr. Raymondo Band and we will fax new rx to Crystal @ [phone] (attn: Crystal). Forrestine Lecrone, Maryjo Rochester, CMA

## 2015-11-09 MED ORDER — INSULIN GLULISINE 100 UNIT/ML IJ SOLN: INTRAMUSCULAR | Status: DC

## 2015-11-09 NOTE — Telephone Encounter (Signed)
Rebecca Terrell I finally figured out the equivalency and Rx is placed in tamika box Denny Levy

## 2015-11-09 NOTE — Telephone Encounter (Signed)
Rx faxed to [facility]. Clovis Pu, RN

## 2015-11-23 ENCOUNTER — Ambulatory Visit (INDEPENDENT_AMBULATORY_CARE_PROVIDER_SITE_OTHER): Payer: Self-pay | Admitting: Family Medicine

## 2015-11-23 ENCOUNTER — Encounter: Payer: Self-pay | Admitting: Family Medicine

## 2015-11-23 VITALS — BP 127/81 | HR 84 | Temp 98.2°F | Ht 62.0 in | Wt 158.3 lb

## 2015-11-23 DIAGNOSIS — E1165 Type 2 diabetes mellitus with hyperglycemia: Secondary | ICD-10-CM

## 2015-11-23 DIAGNOSIS — E118 Type 2 diabetes mellitus with unspecified complications: Secondary | ICD-10-CM

## 2015-11-23 DIAGNOSIS — I10 Essential (primary) hypertension: Secondary | ICD-10-CM

## 2015-11-23 LAB — POCT GLYCOSYLATED HEMOGLOBIN (HGB A1C): HEMOGLOBIN A1C: 11.7

## 2015-11-23 MED ORDER — FLUCONAZOLE 150 MG PO TABS: ORAL_TABLET | ORAL | Status: DC

## 2015-11-23 NOTE — Assessment & Plan Note (Addendum)
Blood sugar control is extremely poor. We spoke with the health department to try to straighten out some of the confusing issues regarding dose and medication amount..  We called medications systems program at the helped her today and spoke with Crystal. They have yet to get the approval for her to get the Apidra where the Lantus. They will be able to give her 7030 next the only 9 days at a time. I discussed this with her extensively spending 50% are more of our 40 minute office is in counseling education regarding absolute need to get insulin even if she misses other medications such as her metformin. She also continues to have recurrent vaginitis from yeast infections and needs a refill on her fluconazole. I gave her prescription for that. I'll see her back in 6 weeks.

## 2015-11-23 NOTE — Patient Instructions (Signed)
Please pick up the 70/30 at the health department They can only dispense 9 days at a time and it will cost you $6 each time I have sent all of the paperwork in and they have received it to try and get you on lantus insulin as well as Apidra (short acting) from the drug company so hopefully that wi; be approved soon. Let me see you in 6 weeks

## 2015-11-23 NOTE — Progress Notes (Signed)
   Subjective:    Patient ID: Rebecca Terrell, female    DOB: Mar 09, 1971, 45 y.o.   MRN: [id number]  HPI  Interpreter used #1. Diabetes mellitus: Has not been checking her blood sugars. She tells we she runs out of insulin after 3 weeks. She does not know anything about getting the additional Apidra insulin to replace her NovoLog. She does not remember whether she's on Lantus her 70/30.   Review of Systems No unusual weight change, fever, sweats, chills. She's had no increase in urinary frequency, no thirst.    Objective:   Physical Exam  Vital signs reviewed. GENERAL: Well-developed, well-nourished, no acute distress. CARDIOVASCULAR: Regular rate and rhythm no murmur gallop or rub LUNGS: Clear to auscultation bilaterally, no rales or wheeze. ABDOMEN: Soft positive bowel sounds MSK: Movement of extremity x 4.        Assessment & Plan:

## 2015-11-24 LAB — COMPLETE METABOLIC PANEL WITH GFR
ALBUMIN: 3.8 g/dL (ref 3.6–5.1)
ALK PHOS: 89 U/L (ref 33–115)
ALT: 11 U/L (ref 6–29)
AST: 11 U/L (ref 10–35)
BILIRUBIN TOTAL: 0.4 mg/dL (ref 0.2–1.2)
BUN: 16 mg/dL (ref 7–25)
CO2: 24 mmol/L (ref 20–31)
Calcium: 9.8 mg/dL (ref 8.6–10.2)
Chloride: 98 mmol/L (ref 98–110)
Creat: 0.62 mg/dL (ref 0.50–1.10)
GFR, Est Non African American: >89 mL/min (ref >=60)
Glucose, Bld: 294 mg/dL — ABNORMAL HIGH (ref 65–99)
Potassium: 3.6 mmol/L (ref 3.5–5.3)
SODIUM: 132 mmol/L — AB (ref 135–146)
TOTAL PROTEIN: 7.8 g/dL (ref 6.1–8.1)

## 2015-11-25 ENCOUNTER — Encounter: Payer: Self-pay | Admitting: Family Medicine

## 2015-12-14 ENCOUNTER — Other Ambulatory Visit: Payer: Self-pay | Admitting: *Deleted

## 2015-12-14 DIAGNOSIS — E118 Type 2 diabetes mellitus with unspecified complications: Secondary | ICD-10-CM

## 2015-12-14 DIAGNOSIS — Z794 Long term (current) use of insulin: Principal | ICD-10-CM

## 2015-12-14 MED ORDER — LISINOPRIL-HYDROCHLOROTHIAZIDE 20-25 MG PO TABS: 1.0000 | ORAL_TABLET | Freq: Every day | ORAL | Status: DC

## 2015-12-15 ENCOUNTER — Other Ambulatory Visit: Payer: Self-pay | Admitting: *Deleted

## 2015-12-15 DIAGNOSIS — Z794 Long term (current) use of insulin: Principal | ICD-10-CM

## 2015-12-15 DIAGNOSIS — E118 Type 2 diabetes mellitus with unspecified complications: Secondary | ICD-10-CM

## 2015-12-15 MED ORDER — METFORMIN HCL 1000 MG PO TABS: 1000.0000 mg | ORAL_TABLET | Freq: Two times a day (BID) | ORAL | Status: DC

## 2015-12-19 MED ORDER — LISINOPRIL-HYDROCHLOROTHIAZIDE 20-25 MG PO TABS: 1.0000 | ORAL_TABLET | Freq: Every day | ORAL | Status: DC

## 2015-12-19 NOTE — Telephone Encounter (Signed)
Rx for lisinopril/HCTZ 20-25 mg stated printed.  Will resend electronically.  Clovis PuMartin, Birgit Nowling L, RN

## 2015-12-19 NOTE — Addendum Note (Signed)
Addended by: Clovis PuMARTIN, Johnmark Geiger L on: 12/19/2015 04:53 PM   Modules accepted: Orders

## 2016-01-04 ENCOUNTER — Ambulatory Visit: Payer: Self-pay | Admitting: Family Medicine

## 2016-01-11 ENCOUNTER — Other Ambulatory Visit: Payer: Self-pay | Admitting: *Deleted

## 2016-01-11 MED ORDER — FLUCONAZOLE 150 MG PO TABS: ORAL_TABLET | ORAL | Status: DC

## 2016-01-18 ENCOUNTER — Ambulatory Visit (INDEPENDENT_AMBULATORY_CARE_PROVIDER_SITE_OTHER): Payer: Self-pay | Admitting: Family Medicine

## 2016-01-18 VITALS — BP 130/82 | HR 75 | Temp 98.2°F | Ht 62.0 in | Wt 163.7 lb

## 2016-01-18 DIAGNOSIS — E1165 Type 2 diabetes mellitus with hyperglycemia: Secondary | ICD-10-CM

## 2016-01-18 DIAGNOSIS — E118 Type 2 diabetes mellitus with unspecified complications: Secondary | ICD-10-CM

## 2016-01-18 MED ORDER — FLUCONAZOLE 150 MG PO TABS: ORAL_TABLET | ORAL | Status: DC

## 2016-01-18 NOTE — Patient Instructions (Addendum)
For your insulin, take 55 units of the 70/30 mix twice a day and take the apidra 15 units at dinner BREAKFAST One shot 70/30 DINNER One shot of 70/30 PLUS 15 units at f dinner of Apidra

## 2016-01-19 NOTE — Assessment & Plan Note (Signed)
Uncontrolled. I did not even get an A1c today because when we spoke she could not tell me exactly what her medication regimen was. I added up calling the pharmacy is the health department. I spent greater than 50% of our 25 minute office visit in counseling education with her and her family member VAX as her interpreter by her request. I have updated the insulin regimen in the chart. I'll see her back 6-8 weeks. Stressed the also importance of getting control of this. I think it is contribute to her headaches will recheck those at next office visit. She does not describe any red flags that would make me concerned a headaches or anything other than either tension headaches are related to her poor blood glucose control.

## 2016-01-19 NOTE — Progress Notes (Signed)
   Subjective:    Patient ID: Rebecca Terrell, female    DOB: 01-12-1971, 45 y.o.   MRN: [id number]  HPI #1. Diabetes mellitus: Elevated A1c at last office visit. We had worked to get her medication regimen straightened out and get some medication available for her. She is here for follow-up. She has not been checking her blood sugars. She's had no episodes of low blood sugar. She's had no change in her urinary frequency. Her appetite is the same. She does have occasional headaches that occur midday, usually resolved by that evening or after sleeping. Not associated with any blurred vision or tinnitus.   Review of Systems No unusual weight change, fever, sweats, chills.    Objective:   Physical Exam  Vital signs reviewed. GENERAL: Well-developed, well-nourished, no acute distress. CARDIOVASCULAR: Regular rate and rhythm no murmur gallop or rub LUNGS: Clear to auscultation bilaterally, no rales or wheeze. ABDOMEN: Soft positive bowel sounds NEURO: No gross focal neurological deficits. MSK: Movement of extremity x 4.        Assessment & Plan:

## 2016-02-15 ENCOUNTER — Other Ambulatory Visit: Payer: Self-pay | Admitting: Family Medicine

## 2016-03-15 ENCOUNTER — Ambulatory Visit: Payer: Self-pay

## 2016-04-23 ENCOUNTER — Other Ambulatory Visit: Payer: Self-pay | Admitting: Family Medicine

## 2016-06-12 ENCOUNTER — Other Ambulatory Visit: Payer: Self-pay | Admitting: Family Medicine

## 2016-07-11 ENCOUNTER — Other Ambulatory Visit: Payer: Self-pay | Admitting: Family Medicine

## 2016-08-03 ENCOUNTER — Other Ambulatory Visit: Payer: Self-pay | Admitting: Family Medicine

## 2016-08-03 NOTE — Telephone Encounter (Signed)
2nd request. Please advise. Thanks! ep °

## 2016-08-03 NOTE — Telephone Encounter (Signed)
Pt needs a refill on metformin. Pt has been out of it for awhile. Pt has an appointment to see Dr. Jennette KettleNeal on Nov. 8th. Pt uses [facility] [address] Please call pt if medication is called in. Please advise. Thanks! ep

## 2016-08-15 ENCOUNTER — Ambulatory Visit (INDEPENDENT_AMBULATORY_CARE_PROVIDER_SITE_OTHER): Payer: Self-pay | Admitting: Family Medicine

## 2016-08-15 ENCOUNTER — Other Ambulatory Visit (HOSPITAL_COMMUNITY)
Admission: RE | Admit: 2016-08-15 | Discharge: 2016-08-15 | Disposition: A | Discharge status: Home / Self Care | Payer: No Typology Code available for payment source | Source: Ambulatory Visit | Attending: Family Medicine | Admitting: Family Medicine

## 2016-08-15 ENCOUNTER — Encounter: Payer: Self-pay | Admitting: Family Medicine

## 2016-08-15 VITALS — BP 128/79 | HR 76 | Temp 98.3°F | Ht 62.0 in | Wt 165.6 lb

## 2016-08-15 DIAGNOSIS — Z01419 Encounter for gynecological examination (general) (routine) without abnormal findings: Secondary | ICD-10-CM | POA: Insufficient documentation

## 2016-08-15 DIAGNOSIS — Z1151 Encounter for screening for human papillomavirus (HPV): Secondary | ICD-10-CM | POA: Insufficient documentation

## 2016-08-15 DIAGNOSIS — Z124 Encounter for screening for malignant neoplasm of cervix: Secondary | ICD-10-CM

## 2016-08-15 DIAGNOSIS — Z299 Encounter for prophylactic measures, unspecified: Secondary | ICD-10-CM | POA: Insufficient documentation

## 2016-08-15 DIAGNOSIS — E118 Type 2 diabetes mellitus with unspecified complications: Secondary | ICD-10-CM

## 2016-08-15 DIAGNOSIS — Z794 Long term (current) use of insulin: Secondary | ICD-10-CM

## 2016-08-15 DIAGNOSIS — E1165 Type 2 diabetes mellitus with hyperglycemia: Secondary | ICD-10-CM

## 2016-08-15 DIAGNOSIS — Z23 Encounter for immunization: Secondary | ICD-10-CM

## 2016-08-15 LAB — POCT GLYCOSYLATED HEMOGLOBIN (HGB A1C): HEMOGLOBIN A1C: 10.5

## 2016-08-15 MED ORDER — METFORMIN HCL 1000 MG PO TABS: ORAL_TABLET | ORAL | 12 refills | Status: DC

## 2016-08-15 MED ORDER — POTASSIUM CHLORIDE ER 10 MEQ PO CPCR: 20.0000 meq | ORAL_CAPSULE | Freq: Every day | ORAL | 3 refills | Status: DC

## 2016-08-15 MED ORDER — FLUCONAZOLE 150 MG PO TABS: ORAL_TABLET | ORAL | 12 refills | Status: DC

## 2016-08-15 MED ORDER — INSULIN NPH ISOPHANE & REGULAR (70-30) 100 UNIT/ML ~~LOC~~ SUSP: SUBCUTANEOUS | 12 refills | Status: DC

## 2016-08-15 NOTE — Assessment & Plan Note (Signed)
Pap smear today. 

## 2016-08-15 NOTE — Patient Instructions (Addendum)
INCREASE YOUR INSULIN 70/30 TO 60 UNITS TWICE A DAY SEE ME IN 2 MONTHS YOUR GOAL A1C FOR NEXT VISIT IS 8.0 AND YOUR ULTIMATE GOAL IS <7.0 I know you can do this as you improved  More than a point this time. Your A1C today is 10.5 and at last visit you were 11.7.  I will send you a note about your pap smear

## 2016-08-15 NOTE — Progress Notes (Signed)
    CHIEF COMPLAINT / HPI: Video interpreter used Follow diabetes mellitus. She think she's made some fairly significant changes in her diet. She's not checking her blood sugars. She continues to have problems with headaches although this may be somewhat improved. She is using ibuprofen over-the-counter on day she has headaches and usually has some resolution within 2 hours.  Also wants to get her Pap smear today. No pelvic pain.  REVIEW OF SYSTEMS:  No increased thirst, no increased frequency of urination. No weight change. No nausea vomiting.  OBJECTIVE:  Vital signs are reviewed.   Vital signs reviewed. GENERAL: Well-developed, well-nourished, no acute distress. CARDIOVASCULAR: Regular rate and rhythm no murmur gallop or rub LUNGS: Clear to auscultation bilaterally, no rales or wheeze. ABDOMEN: Soft positive bowel sounds NEURO: No gross focal neurological deficits. MSK: Movement of extremity x 4.    ASSESSMENT / PLAN: Please see problem oriented charting for details

## 2016-08-15 NOTE — Assessment & Plan Note (Addendum)
I made her tell me exactly what she was taking for her insulin. She's not been taking the mealtime insulin but by her report is taking the 7030 mix twice a day. I will give her refill on that. Would like to see her back in 6 weeks. At last office visit I ask her to come back in 6 weeks and she came back in 4 months so I made sure she understood this. She had very slight improvement since her last A1c but it is improvement and I congratulated her on that. We will increase her 70/30 insulin dosing to 60 units twice a day from her current dose of 50 units twice a day. This is probably still not enough to want to go slowly.

## 2016-08-17 LAB — CYTOLOGY - PAP
Diagnosis: NEGATIVE
HPV (WINDOPATH): NOT DETECTED

## 2016-09-02 ENCOUNTER — Encounter: Payer: Self-pay | Admitting: Family Medicine

## 2016-12-03 ENCOUNTER — Telehealth: Payer: Self-pay | Admitting: *Deleted

## 2016-12-03 MED ORDER — INSULIN GLULISINE 100 UNIT/ML IJ SOLN: INTRAMUSCULAR | 12 refills | Status: DC

## 2016-12-03 MED ORDER — INSULIN GLARGINE 100 UNIT/ML ~~LOC~~ SOLN: SUBCUTANEOUS | 12 refills | Status: DC

## 2016-12-03 NOTE — Telephone Encounter (Signed)
Received fax from [facility] requesting to change Humulin 70/30 back to Lantus.  Patient was not approved by [employer] to receive free Humulin.  Also request for new Rx for Apidra. Faxes signed by Dr. Jennette KettleNeal and faxed to [facility]. Clovis PuMartin, Avory Rahimi L, RN

## 2017-01-29 ENCOUNTER — Other Ambulatory Visit: Payer: Self-pay | Admitting: Family Medicine

## 2017-01-29 DIAGNOSIS — Z794 Long term (current) use of insulin: Principal | ICD-10-CM

## 2017-01-29 DIAGNOSIS — E118 Type 2 diabetes mellitus with unspecified complications: Secondary | ICD-10-CM

## 2017-02-21 ENCOUNTER — Ambulatory Visit: Payer: No Typology Code available for payment source

## 2017-03-06 ENCOUNTER — Ambulatory Visit (INDEPENDENT_AMBULATORY_CARE_PROVIDER_SITE_OTHER): Payer: Self-pay | Admitting: Family Medicine

## 2017-03-06 ENCOUNTER — Encounter: Payer: Self-pay | Admitting: Family Medicine

## 2017-03-06 VITALS — HR 72 | Temp 98.4°F | Ht 62.0 in | Wt 167.8 lb

## 2017-03-06 DIAGNOSIS — E118 Type 2 diabetes mellitus with unspecified complications: Secondary | ICD-10-CM

## 2017-03-06 DIAGNOSIS — E1165 Type 2 diabetes mellitus with hyperglycemia: Secondary | ICD-10-CM

## 2017-03-06 LAB — POCT GLYCOSYLATED HEMOGLOBIN (HGB A1C): HEMOGLOBIN A1C: 11.6

## 2017-03-06 MED ORDER — INSULIN GLARGINE 100 UNIT/ML ~~LOC~~ SOLN: 100.0000 [IU] | Freq: Every day | SUBCUTANEOUS | 12 refills | Status: DC

## 2017-03-06 MED ORDER — FLUCONAZOLE 150 MG PO TABS: ORAL_TABLET | ORAL | 12 refills | Status: DC

## 2017-03-06 NOTE — Patient Instructions (Addendum)
INCREASE your glargine insulin from 70 units to 100 units a day. See me in 3 months. Cut down on the CARBS! I will send you a note about your blood work.

## 2017-03-06 NOTE — Progress Notes (Signed)
    CHIEF COMPLAINT / HPI:   Follow-up diabetes mellitus. Says she's been taking her 70 units every morning. She does occasionally miss the evening sliding scale dose of her short acting insulin. She thinks it gives her leg cramps. Says she probably misses it 3 times a week. She eats lots of flour tortilla is and drinks a lot of diet soda. She's not getting any regular exercise.  REVIEW OF SYSTEMS:  Denies blurry vision. No headache. No unusual weight change. No episodes of low blood sugar. No increased frequency of urination, no burning on urination.  OBJECTIVE:  Vital signs are reviewed.   Vital signs reviewed. GENERAL: Well-developed, well-nourished, no acute distress. CARDIOVASCULAR: Regular rate and rhythm no murmur gallop or rub LUNGS: Clear to auscultation bilaterally, no rales or wheeze. ABDOMEN: Soft positive bowel sounds NEURO: No gross focal neurological deficits. MSK: Movement of extremity x 4.    ASSESSMENT / PLAN: Please see problem oriented charting for details

## 2017-03-06 NOTE — Assessment & Plan Note (Signed)
Absolutely terrible control of her diabetes mellitus. Unfortunately this is been her chronic pattern.  50% of my 30 minute office visit in counseling education regarding absolute need for some type of better control. I will increase her long-acting a insulin from 70 units to 100 units. I will see her back in 3 months. We discussed diet modifications. Also gave her refill on her Diflucan.

## 2017-03-07 LAB — BASIC METABOLIC PANEL
BUN / CREAT RATIO: 22 (ref 9–23)
BUN: 11 mg/dL (ref 6–24)
CHLORIDE: 101 mmol/L (ref 96–106)
CO2: 21 mmol/L (ref 18–29)
Calcium: 9.2 mg/dL (ref 8.7–10.2)
Creatinine, Ser: 0.49 mg/dL — ABNORMAL LOW (ref 0.57–1.00)
GFR, EST AFRICAN AMERICAN: 135 mL/min/{1.73_m2} (ref >59)
GFR, EST NON AFRICAN AMERICAN: 117 mL/min/{1.73_m2} (ref >59)
Glucose: 206 mg/dL — ABNORMAL HIGH (ref 65–99)
POTASSIUM: 4 mmol/L (ref 3.5–5.2)
Sodium: 137 mmol/L (ref 134–144)

## 2017-03-07 LAB — LDL CHOLESTEROL, DIRECT: LDL Direct: 125 mg/dL — ABNORMAL HIGH (ref 0–99)

## 2017-03-08 ENCOUNTER — Encounter: Payer: Self-pay | Admitting: Family Medicine

## 2017-04-06 ENCOUNTER — Other Ambulatory Visit: Payer: Self-pay | Admitting: Family Medicine

## 2017-04-06 DIAGNOSIS — Z794 Long term (current) use of insulin: Principal | ICD-10-CM

## 2017-04-06 DIAGNOSIS — E118 Type 2 diabetes mellitus with unspecified complications: Secondary | ICD-10-CM

## 2017-04-09 ENCOUNTER — Ambulatory Visit (INDEPENDENT_AMBULATORY_CARE_PROVIDER_SITE_OTHER): Payer: No Typology Code available for payment source | Admitting: Family Medicine

## 2017-04-09 ENCOUNTER — Encounter: Payer: Self-pay | Admitting: Family Medicine

## 2017-04-09 ENCOUNTER — Telehealth: Payer: Self-pay

## 2017-04-09 VITALS — BP 118/80 | HR 92 | Temp 98.2°F | Wt 165.0 lb

## 2017-04-09 DIAGNOSIS — R1012 Left upper quadrant pain: Secondary | ICD-10-CM

## 2017-04-09 DIAGNOSIS — R590 Localized enlarged lymph nodes: Secondary | ICD-10-CM

## 2017-04-09 MED ORDER — IBUPROFEN 400 MG PO TABS: 400.0000 mg | ORAL_TABLET | Freq: Four times a day (QID) | ORAL | 0 refills | Status: DC | PRN

## 2017-04-09 NOTE — Progress Notes (Signed)
Subjective:     Patient ID: Rebecca Terrell, female   DOB: [birth year], 46 y.o.   MRN: [id number]  She came with her interpreter and requested I used this interpreter.  Abdominal Pain  This is a new problem. The current episode started more than 1 month ago. The onset quality is gradual. The problem occurs intermittently (occurs intermittently for 30 min daily). The problem has been gradually worsening. The pain is located in the LUQ. The pain is at a severity of 9/10. The pain is moderate. The quality of the pain is sharp. The abdominal pain radiates to the back. Associated symptoms include constipation, diarrhea, headaches and nausea. Pertinent negatives include no anorexia, melena or vomiting. Associated symptoms comments: Mostly constipated. However, this morning she had diarrhea. At times she feels food stuck in her throat, this occurs at the same time when she is having stomach pain. The pain is aggravated by movement. The pain is relieved by recumbency. She has tried acetaminophen for the symptoms. The treatment provided moderate relief. Her past medical history is significant for gallstones. There is no history of abdominal surgery, GERD, pancreatitis or PUD. Denies trauma to the side.  Neck bump: C/O small bump on the left side of her neck that has been there for more than 1 months. She denies any pain, no change in size, no trauma to her neck.  Current Outpatient Prescriptions on File Prior to Visit  Medication Sig Dispense Refill  . aspirin 81 MG chewable tablet Chew 1 tablet (81 mg total) by mouth daily. 90 tablet 3  . fluconazole (DIFLUCAN) 150 MG tablet Take one by mouth as needed for yeast infection once a week 12 tablet 12  . insulin glargine (LANTUS) 100 UNIT/ML injection Inject 1 mL (100 Units total) into the skin daily. 35 mL 12  . insulin glulisine (APIDRA) 100 UNIT/ML injection Inject 15-25 units with dinner subcutaneously for blood sugar control 10 mL 12  .  lisinopril-hydrochlorothiazide (PRINZIDE,ZESTORETIC) 20-25 MG tablet Take 1 tablet by mouth daily. 90 tablet 3  . metFORMIN (GLUCOPHAGE) 1000 MG tablet Take one by mouth twice daily with meals 60 tablet 12  . potassium chloride (MICRO-K) 10 MEQ CR capsule Take 2 capsules (20 mEq total) by mouth daily. 180 capsule 3   No current facility-administered medications on file prior to visit.    No past medical history on file.   Review of Systems  Respiratory: Negative.   Cardiovascular: Negative.   Gastrointestinal: Positive for abdominal pain, constipation, diarrhea and nausea. Negative for anorexia, melena and vomiting.  Genitourinary: Negative.   Neurological: Positive for headaches.  All other systems reviewed and are negative.      Objective:   Physical Exam  Constitutional: No distress.  Cardiovascular: Normal rate, regular rhythm, normal heart sounds and intact distal pulses.   No murmur heard. Pulmonary/Chest: Effort normal and breath sounds normal. No respiratory distress. She has no wheezes.  Abdominal: Soft. Normal appearance and bowel sounds are normal. There is no splenomegaly or hepatomegaly. There is tenderness in the left upper quadrant. There is no rigidity, no rebound, no guarding, no CVA tenderness, no tenderness at McBurney's point and negative Murphy's sign.  Musculoskeletal: Normal range of motion.       Cervical back: Normal.       Thoracic back: Normal.       Lumbar back: Normal.  Lymphadenopathy:    She has cervical adenopathy.       Right cervical: No posterior cervical adenopathy present.  Left cervical: Posterior cervical adenopathy present.  Left posterior cervical adenopathy measuring 1cm by 1cm, firm, mobile and non-tender  Nursing note and vitals reviewed.      Assessment:     LUQ abdominal pain: Chronic Lymphadenopathy     Plan:     1. Etiology of her abdominal pain unclear.    ?? IBS vs constipation. Splenic infarct unlikely since pain  is not as severe on physical exam.     Liver pathology unlikely given location of her pain.     Given chronicity and how much discomfort, abdominal US recommended.     She is aware of her US appointment date and time.     Labs not done today since she had recent lab work late May. Result reviewed and discussed with her.     In the mean time Ibuprofen prescribed prn pain.     Return precaution discussed.     I will contact her with US result soon.  2. Patient reassured that her cervical lymphadenopathy is likely benign.     We will monitor for now.     If there is a change in size or becomes painful she is to come in soon for follow-up.     She verbalized understanding.

## 2017-04-09 NOTE — Telephone Encounter (Signed)
Pts US was originally scheduled for 7/16, per PCP her US needs to be sooner. Pt had already eaten today, so per scheduler was unable to schedule for today. They are closed tomm. [facility] Using [employer] interpreter [phone], pt was contacted and notified of this. Pt asked to have apt scheduled for same say but later in the afternoon. I contacted scheduling and attempted this, however, they do not schedule these apts in the afternoon. Using pacific interpreter [phone], pt was contacted again, however, did not answer, so a VM was left informing her of this and [facility]

## 2017-04-09 NOTE — Patient Instructions (Signed)
It was nice seeing you today. I am sorry about your abdominal pain. I can't tell exactly what is causing it. There are various organ in that area. Let us get an ultrasound first. If it is normal, I will treat you for irritable bowel or you might need to see a stomach doctor if this continues. See Dr. Jennette KettleNeal soon.

## 2017-04-11 ENCOUNTER — Ambulatory Visit (HOSPITAL_COMMUNITY): Payer: No Typology Code available for payment source

## 2017-04-12 ENCOUNTER — Telehealth: Payer: Self-pay | Admitting: Family Medicine

## 2017-04-12 ENCOUNTER — Ambulatory Visit (HOSPITAL_COMMUNITY)
Admission: RE | Admit: 2017-04-12 | Discharge: 2017-04-12 | Disposition: A | Discharge status: Home / Self Care | Payer: No Typology Code available for payment source | Source: Ambulatory Visit | Attending: Family Medicine | Admitting: Family Medicine

## 2017-04-12 DIAGNOSIS — R1012 Left upper quadrant pain: Secondary | ICD-10-CM

## 2017-04-12 DIAGNOSIS — K760 Fatty (change of) liver, not elsewhere classified: Secondary | ICD-10-CM | POA: Insufficient documentation

## 2017-04-12 DIAGNOSIS — K824 Cholesterolosis of gallbladder: Secondary | ICD-10-CM

## 2017-04-12 NOTE — Telephone Encounter (Signed)
Pt called because she had her US done this morning. She would like the doctor to call her with the results. She would also like for the doctor to use an interpreter when she calls. jw

## 2017-04-12 NOTE — Telephone Encounter (Signed)
I will call her once I speak with the gastroenterologist.

## 2017-04-12 NOTE — Telephone Encounter (Signed)
  US result discussed with patient. She has gallbladder polyps and fatty liver which does not correlate with her LUQ abdominal pain. I contacted [facility] to discuss result and recommendation but I was unable to speak with their provider. Question regarding GI vs surgery referral.  As discussed with patient this is likely benign but I will prefer further evaluation by a specialist. I will refer to GI instead for possible endoscopic ultrasound to further evaluate this polyps. She verbalized understanding.   [employer] interpreter used ID# [id number]   [place] Abdomen Complete  Result Date: 04/12/2017 CLINICAL DATA:  Left upper quadrant pain EXAM: ABDOMEN ULTRASOUND COMPLETE COMPARISON:  None. FINDINGS: Gallbladder: No gallstones or wall thickening. 5 mm posterior gallbladder wall polyp. Common bile duct: Diameter: Normal caliber, 3 mm Liver: Increased echotexture compatible with fatty infiltration. No focal abnormality or biliary ductal dilatation. IVC: No abnormality visualized. Pancreas: Visualized portion unremarkable. Spleen: Size and appearance within normal limits. Right Kidney: Length: 12.3 cm. Echogenicity within normal limits. No mass or hydronephrosis visualized. Left Kidney: Length: 12.5 cm. Echogenicity within normal limits. No mass or hydronephrosis visualized. Abdominal aorta: No aneurysm visualized. Other findings: None. IMPRESSION: Small gallbladder wall polyp. No stones or evidence of cholecystitis. Fatty liver. Electronically Signed   By: Charlett NoseKevin  Dover M.D.   On: 04/12/2017 08:12

## 2017-04-22 ENCOUNTER — Ambulatory Visit (HOSPITAL_COMMUNITY): Payer: No Typology Code available for payment source

## 2017-05-14 ENCOUNTER — Other Ambulatory Visit: Payer: Self-pay | Admitting: Family Medicine

## 2017-05-14 DIAGNOSIS — Z1231 Encounter for screening mammogram for malignant neoplasm of breast: Secondary | ICD-10-CM

## 2017-05-15 ENCOUNTER — Ambulatory Visit
Admission: RE | Admit: 2017-05-15 | Discharge: 2017-05-15 | Disposition: A | Discharge status: Home / Self Care | Payer: No Typology Code available for payment source | Source: Ambulatory Visit | Attending: Family Medicine | Admitting: Family Medicine

## 2017-05-15 DIAGNOSIS — Z1231 Encounter for screening mammogram for malignant neoplasm of breast: Secondary | ICD-10-CM

## 2017-05-16 ENCOUNTER — Other Ambulatory Visit: Payer: Self-pay | Admitting: Family Medicine

## 2017-05-16 DIAGNOSIS — R928 Other abnormal and inconclusive findings on diagnostic imaging of breast: Secondary | ICD-10-CM

## 2017-05-31 ENCOUNTER — Other Ambulatory Visit: Payer: Self-pay | Admitting: Obstetrics and Gynecology

## 2017-05-31 DIAGNOSIS — R928 Other abnormal and inconclusive findings on diagnostic imaging of breast: Secondary | ICD-10-CM

## 2017-06-03 ENCOUNTER — Ambulatory Visit (INDEPENDENT_AMBULATORY_CARE_PROVIDER_SITE_OTHER): Payer: Self-pay | Admitting: Internal Medicine

## 2017-06-03 ENCOUNTER — Encounter: Payer: Self-pay | Admitting: Internal Medicine

## 2017-06-03 DIAGNOSIS — E1165 Type 2 diabetes mellitus with hyperglycemia: Secondary | ICD-10-CM

## 2017-06-03 DIAGNOSIS — E118 Type 2 diabetes mellitus with unspecified complications: Secondary | ICD-10-CM

## 2017-06-03 DIAGNOSIS — L02818 Cutaneous abscess of other sites: Secondary | ICD-10-CM

## 2017-06-03 LAB — POCT GLYCOSYLATED HEMOGLOBIN (HGB A1C): Hemoglobin A1C: 12

## 2017-06-03 MED ORDER — DOXYCYCLINE HYCLATE 100 MG PO TABS: 100.0000 mg | ORAL_TABLET | Freq: Two times a day (BID) | ORAL | 0 refills | Status: DC

## 2017-06-03 MED ORDER — IBUPROFEN 400 MG PO TABS: 400.0000 mg | ORAL_TABLET | Freq: Four times a day (QID) | ORAL | 0 refills | Status: AC | PRN

## 2017-06-03 MED ORDER — SULFAMETHOXAZOLE-TRIMETHOPRIM 800-160 MG PO TABS: 1.0000 | ORAL_TABLET | Freq: Two times a day (BID) | ORAL | 0 refills | Status: DC

## 2017-06-03 NOTE — Progress Notes (Signed)
   [facility] Phone: [phone]  Subjective:  Rebecca Terrell is a 46 year old female presenting to clinic with boil of the right axilla. It has been going on for 1 week. The boil has been growing in size and worsening. This is the 2nd boil she has ever had. She has been using warm compresses, peroxide, and hydrocortisone cream which have been helping a little bit. Yesterday, she noticed that it was draining a little bit of yellow pus after her shower. She tried to squeeze the boil and some blood came out. No fevers, no chills, no spreading redness of the skin.  ROS: See HPI for pertinent positives and negatives  Past Medical History- HTN, uncontrolled T2DM, obesity  Family history reviewed for today's visit. No changes.  Social history- patient is a never smoker  Objective: BP 100/70   Pulse 88   Temp 98 F (36.7 C) (Oral)   Wt 167 lb (75.8 kg)   SpO2 99%   BMI 30.54 kg/m  Gen: NAD, alert, cooperative with exam Skin: Large 4cm x 2.5cm oval mass present inferior to the right axilla; mass is erythematous, fluctuant, and exquisitely tender to palpation; no drainage present.  Assessment/Plan: Skin Abscess: Located inferior to the right axilla. Uncontrolled T2DM likely contributing.  - I&D performed in clinic today. Large amount of thick, yellow, purulent maternal expressed from the abscess. See procedure note below. - Given the size of the abscess and some overlying erythema of the skin, will treat with Bactrim x 7 days (initially prescribed Doxycycline, but patient could not afford this). - Continue warm compresses - Follow-up if worsening, otherwise follow-up with PCP as soon as possible to discuss diabetes.  I&D Procedure Note: -Informed consent was obtained. -Timeout performed -The right axilla was prepped with sterile iodine in the usual fashion. -An 11 blade was used to make a 1cm incision across the region of fluctuance. -Purulent drainage was expressed  from the abscess. The area was probed to free any loculations. -The wound was not packed with iodoform gauze. -We discussed wound cares, including frequent application of warm compresses 6 times a day. -We discussed reasons to call or return to the clinic sooner, including increasing pain, fever, or bleeding.   Willadean Carol, MD PGY-3

## 2017-06-03 NOTE — Patient Instructions (Signed)
  Fue tan lindo conocerte! Drenamos tu hervor hoy. Puede notar que contina drenando [employer]. Le recet un antibitico llamado Doxiciclina para ayudar a eliminar la infeccin. Por favor, tome 1 tableta dos veces al da durante [address].  -Dr. Nancy Marus

## 2017-06-05 NOTE — Assessment & Plan Note (Signed)
Located inferior to the left axilla. Uncontrolled T2DM likely contributing.  - I&D performed in clinic today. Large amount of thick, yellow, purulent maternal expressed from the abscess. See procedure note below. - Given the size of the abscess and some overlying erythema of the skin, will treat with Bactrim x 7 days (initially prescribed Doxycycline, but patient could not afford this). - Continue warm compresses - Follow-up if worsening, otherwise follow-up with PCP as soon as possible to discuss diabetes.

## 2017-06-12 ENCOUNTER — Ambulatory Visit: Payer: No Typology Code available for payment source | Admitting: Family Medicine

## 2017-06-20 ENCOUNTER — Ambulatory Visit (HOSPITAL_COMMUNITY): Payer: No Typology Code available for payment source

## 2017-06-20 ENCOUNTER — Other Ambulatory Visit: Payer: No Typology Code available for payment source

## 2017-06-24 ENCOUNTER — Other Ambulatory Visit: Payer: Self-pay | Admitting: Obstetrics and Gynecology

## 2017-06-24 DIAGNOSIS — R928 Other abnormal and inconclusive findings on diagnostic imaging of breast: Secondary | ICD-10-CM

## 2017-07-02 ENCOUNTER — Other Ambulatory Visit (HOSPITAL_COMMUNITY): Payer: Self-pay | Admitting: Gastroenterology

## 2017-07-02 ENCOUNTER — Ambulatory Visit: Payer: No Typology Code available for payment source

## 2017-07-02 ENCOUNTER — Ambulatory Visit
Admission: RE | Admit: 2017-07-02 | Discharge: 2017-07-02 | Disposition: A | Discharge status: Home / Self Care | Payer: No Typology Code available for payment source | Source: Ambulatory Visit | Attending: Obstetrics and Gynecology | Admitting: Obstetrics and Gynecology

## 2017-07-02 ENCOUNTER — Encounter (HOSPITAL_COMMUNITY): Payer: Self-pay

## 2017-07-02 ENCOUNTER — Ambulatory Visit (HOSPITAL_COMMUNITY)
Admission: RE | Admit: 2017-07-02 | Discharge: 2017-07-02 | Disposition: A | Discharge status: Home / Self Care | Payer: Self-pay | Source: Ambulatory Visit | Attending: Obstetrics and Gynecology | Admitting: Obstetrics and Gynecology

## 2017-07-02 VITALS — BP 112/80 | Temp 98.3°F | Wt 171.4 lb

## 2017-07-02 DIAGNOSIS — R928 Other abnormal and inconclusive findings on diagnostic imaging of breast: Secondary | ICD-10-CM

## 2017-07-02 DIAGNOSIS — R1012 Left upper quadrant pain: Secondary | ICD-10-CM

## 2017-07-02 DIAGNOSIS — Z1239 Encounter for other screening for malignant neoplasm of breast: Secondary | ICD-10-CM

## 2017-07-02 HISTORY — DX: Type 2 diabetes mellitus without complications: E11.9

## 2017-07-02 HISTORY — DX: Essential (primary) hypertension: I10

## 2017-07-02 NOTE — Progress Notes (Addendum)
Patient referred to [facility] by the [facility] due to recommending additional imaging of the right breast. Screening mammogram completed 05/15/2017.  Pap Smear: Pap smear not completed today. Last Pap smear was 08/15/2016 at [facility] and normal with negative HPV. Per patient has no history of an abnormal Pap smear. Last Pap smear result is in EPIC.  Physical exam: Breasts Breasts symmetrical. No skin abnormalities left breast. Observed a healing area within the right breast at 10 o'clock 7 cm from the nipple that patient states had a boil removed a couple of weeks ago. No nipple retraction bilateral breasts. No nipple discharge bilateral breasts. No lymphadenopathy. No lumps palpated bilateral breasts. No complaints of pain or tenderness on exam. Referred patient to the [facility] for right breast diagnostic mammogram and breast ultrasound per recommendation. Appointment scheduled for Tuesday, July 02, 2017 at 1450.        Pelvic/Bimanual No Pap smear completed today since last Pap smear and HPV typing was 08/15/2016. Pap smear not indicated per BCCCP guidelines.   Smoking History: Patient has never smoked.  Patient Navigation: Patient education provided. Access to services provided for patient through [facility] program. Spanish interpreter provided.  Used Spanish interpreter Leana Roe from [place].

## 2017-07-02 NOTE — Patient Instructions (Addendum)
Explained breast self awareness with Rebecca Terrell. Patient did not need a Pap smear today due to last Pap smear and HPV typing was 08/15/2016. Let her know BCCCP will cover Pap smears and HPV typing every 5 years unless has a history of abnormal Pap smears. Referred patient to the [facility] for right breast diagnostic mammogram and breast ultrasound per recommendation. Appointment scheduled for Tuesday, July 02, 2017 at 1450. Rebecca Terrell verbalized understanding.  Semira Stoltzfus, Kathaleen Maser, RN 3:30 PM

## 2017-07-05 ENCOUNTER — Other Ambulatory Visit (HOSPITAL_COMMUNITY): Payer: Self-pay | Admitting: *Deleted

## 2017-07-05 ENCOUNTER — Encounter (HOSPITAL_COMMUNITY): Payer: Self-pay | Admitting: *Deleted

## 2017-07-10 ENCOUNTER — Encounter (HOSPITAL_COMMUNITY): Payer: Self-pay

## 2017-07-10 ENCOUNTER — Ambulatory Visit (HOSPITAL_COMMUNITY)
Admission: RE | Admit: 2017-07-10 | Discharge: 2017-07-10 | Disposition: A | Discharge status: Home / Self Care | Payer: No Typology Code available for payment source | Source: Ambulatory Visit | Attending: Gastroenterology | Admitting: Gastroenterology

## 2017-07-10 DIAGNOSIS — R918 Other nonspecific abnormal finding of lung field: Secondary | ICD-10-CM | POA: Insufficient documentation

## 2017-07-10 DIAGNOSIS — R1012 Left upper quadrant pain: Secondary | ICD-10-CM

## 2017-07-10 LAB — POCT I-STAT CREATININE: Creatinine, Ser: 0.5 mg/dL (ref 0.44–1.00)

## 2017-07-10 MED ORDER — IOPAMIDOL (ISOVUE-300) INJECTION 61%
INTRAVENOUS | Status: AC
  Filled 2017-07-10: qty 100

## 2017-07-10 MED ORDER — IOPAMIDOL (ISOVUE-300) INJECTION 61%
100.0000 mL | Freq: Once | INTRAVENOUS | Status: AC | PRN
  Administered 2017-07-10: 100 mL via INTRAVENOUS

## 2017-08-08 ENCOUNTER — Other Ambulatory Visit: Payer: Self-pay | Admitting: Family Medicine

## 2017-08-20 ENCOUNTER — Ambulatory Visit: Payer: Self-pay

## 2017-09-04 ENCOUNTER — Ambulatory Visit: Payer: No Typology Code available for payment source | Admitting: Family Medicine

## 2017-12-17 ENCOUNTER — Other Ambulatory Visit: Payer: Self-pay | Admitting: *Deleted

## 2017-12-18 MED ORDER — INSULIN GLULISINE 100 UNIT/ML IJ SOLN: INTRAMUSCULAR | 12 refills | Status: DC

## 2017-12-23 ENCOUNTER — Other Ambulatory Visit: Payer: Self-pay

## 2017-12-23 MED ORDER — INSULIN GLULISINE 100 UNIT/ML IJ SOLN: INTRAMUSCULAR | 12 refills | Status: DC

## 2017-12-31 ENCOUNTER — Telehealth: Payer: Self-pay | Admitting: Family Medicine

## 2017-12-31 NOTE — Telephone Encounter (Signed)
Patient is wanting to see if you can add her to the schedule tomorrow for high blood sugar, vomitting and headache.  She did not want to see a different doctor on same day schedule.  Thanks.

## 2017-12-31 NOTE — Telephone Encounter (Signed)
Dr. Jennette KettleNeal is not seeing patients tomorrow. Sunday SpillersSharon T Parminder Cupples, CMA

## 2018-01-08 ENCOUNTER — Encounter: Payer: Self-pay | Admitting: Family Medicine

## 2018-01-08 ENCOUNTER — Ambulatory Visit (INDEPENDENT_AMBULATORY_CARE_PROVIDER_SITE_OTHER): Payer: Self-pay | Admitting: Family Medicine

## 2018-01-08 ENCOUNTER — Other Ambulatory Visit: Payer: Self-pay

## 2018-01-08 VITALS — BP 170/86 | HR 69 | Temp 98.9°F | Ht 62.0 in | Wt 180.2 lb

## 2018-01-08 DIAGNOSIS — E118 Type 2 diabetes mellitus with unspecified complications: Secondary | ICD-10-CM

## 2018-01-08 DIAGNOSIS — Z794 Long term (current) use of insulin: Secondary | ICD-10-CM

## 2018-01-08 DIAGNOSIS — E1165 Type 2 diabetes mellitus with hyperglycemia: Secondary | ICD-10-CM

## 2018-01-08 LAB — POCT GLYCOSYLATED HEMOGLOBIN (HGB A1C): Hemoglobin A1C: 10.2

## 2018-01-08 MED ORDER — INSULIN GLULISINE 100 UNIT/ML IJ SOLN: INTRAMUSCULAR | 12 refills | Status: DC

## 2018-01-08 MED ORDER — METFORMIN HCL 1000 MG PO TABS: 1000.0000 mg | ORAL_TABLET | Freq: Two times a day (BID) | ORAL | 3 refills | Status: DC

## 2018-01-08 MED ORDER — INSULIN GLARGINE 100 UNIT/ML ~~LOC~~ SOLN: 100.0000 [IU] | Freq: Every day | SUBCUTANEOUS | 12 refills | Status: DC

## 2018-01-08 MED ORDER — LISINOPRIL-HYDROCHLOROTHIAZIDE 20-25 MG PO TABS: 1.0000 | ORAL_TABLET | Freq: Every day | ORAL | 3 refills | Status: DC

## 2018-01-08 MED ORDER — FLUCONAZOLE 150 MG PO TABS: ORAL_TABLET | ORAL | 12 refills | Status: DC

## 2018-01-08 NOTE — Patient Instructions (Addendum)
Diabetes mellitus y nutrición  Diabetes Mellitus and Nutrition  Si sufre de diabetes (diabetes mellitus), es muy importante tener hábitos alimenticios saludables debido a que sus niveles de azúcar en la sangre (glucosa) se ven afectados en gran medida por lo que come y bebe. Comer alimentos saludables en las cantidades adecuadas, aproximadamente a la misma hora todos los días, lo ayudará a:  · Controlar la glucemia.  · Disminuir el riesgo de sufrir una enfermedad cardíaca.  · Mejorar la presión arterial.  · Alcanzar o mantener un peso saludable.    Todas las personas que sufren de diabetes son diferentes y cada una tiene necesidades diferentes en cuanto a un plan de alimentación. El médico puede recomendarle que trabaje con un especialista en dietas y nutrición (nutricionista) para elaborar el mejor plan para usted. Su plan de alimentación puede variar según factores como:  · Las calorías que necesita.  · Los medicamentos que toma.  · Su peso.  · Sus niveles de glucemia, presión arterial y colesterol.  · Su nivel de actividad.  · Otras afecciones que tenga, como enfermedades cardíacas o renales.    ¿Cómo me afectan los carbohidratos?  Los carbohidratos afectan el nivel de glucemia más que cualquier otro tipo de alimento. La ingesta de carbohidratos naturalmente aumenta la cantidad glucosa en la sangre. El recuento de carbohidratos es un método destinado a llevar un registro de la cantidad de carbohidratos que se ingieren. El recuento de carbohidratos es importante para mantener la glucemia a un nivel saludable, en especial si utiliza insulina o toma determinados medicamentos por vía oral para la diabetes.  Es importante saber la cantidad de carbohidratos que se pueden ingerir en cada comida sin correr ningún riesgo. Esto es diferente en cada persona. El nutricionista puede ayudarlo a calcular la cantidad de carbohidratos que debe ingerir en cada comida y colación.   Los alimentos que contienen carbohidratos incluyen:  · Pan, cereal, arroz, pasta y galletas.  · Papas y maíz.  · Guisantes, frijoles y lentejas.  · Leche y yogur.  · Frutas y jugo.  · Postres, como pasteles, galletitas, helado y caramelos.    ¿Cómo me afecta el alcohol?  El alcohol puede provocar disminuciones súbitas de la glucemia (hipoglucemia), en especial si utiliza insulina o toma determinados medicamentos por vía oral para la diabetes. La hipoglucemia es una afección potencialmente mortal. Los síntomas de la hipoglucemia (somnolencia, mareos y confusión) son similares a los síntomas de haber consumido demasiado alcohol.  Si el médico afirma que el alcohol es seguro para usted, siga estas pautas:  · Limite el consumo de alcohol a no más de 1 medida por día si es mujer y no está embarazada, y a 2 medidas si es hombre. Una medida equivale a 12 oz (355 ml) de cerveza, 5 oz (148 ml) de vino o 1½ oz (44 ml) de bebidas de alta graduación alcohólica.  · No beba con el estómago vacío.  · Manténgase hidratado con agua, gaseosas dietéticas o té helado sin azúcar.  · Tenga en cuenta que las gaseosas comunes, los jugos y otros refrescos pueden contener mucha azúcar y se deben contar como carbohidratos.    Consejos para seguir este plan  Leer las etiquetas de los alimentos  · Comience por controlar el tamaño de la porción en la etiqueta. La cantidad de calorías, carbohidratos, grasas y otros nutrientes mencionados en la etiqueta se basan en una porción del alimento. Muchos alimentos contienen más de una porción por envase.  · Verifique la cantidad total de gramos (g)   de carbohidratos totales en una porción. Puede calcular la cantidad de porciones de carbohidratos al dividir el total de carbohidratos por 15. Por ejemplo, si un alimento posee un total de 30 g de carbohidratos, equivale a 2 porciones de carbohidratos.  · Verifique la cantidad de gramos (g) de grasas saturadas y grasas trans  en una porción. Escoja alimentos que no contengan grasa o que tengan un bajo contenido.  · Controle la cantidad de miligramos (mg) de sodio en una porción. La mayoría de las personas deben limitar la ingesta de sodio total a menos de 2300 mg por día.  · Siempre consulte la información nutricional de los alimentos etiquetados como “con bajo contenido de grasa” o “sin grasa”. Estos alimentos pueden ser más altos en azúcar agregada o en carbohidratos refinados y deben evitarse.  · Hable con el nutricionista para identificar sus objetivos diarios en cuanto a los nutrientes mencionados en la etiqueta.  De compras  · Evite comprar alimentos procesados, enlatados o prehechos. Estos alimentos tienden a tener mayor cantidad de grasa, sodio y azúcar agregada.  · Compre en la zona exterior de la tienda de comestibles. Esta incluye frutas y vegetales frescos, granos a granel, carnes frescas y productos lácteos frescos.  Cocción  · Utilice métodos de cocción a baja temperatura, como hornear, en lugar de métodos de cocción a alta temperatura, como freír en abundante aceite.  · Cocine con aceites saludables, como el aceite de oliva, canola o girasol.  · Evite cocinar con manteca, crema o carnes con alto contenido de grasa.  Planificación de las comidas  · Consuma las comidas y las colaciones de forma regular, preferentemente a la misma hora todos los días. Evite pasar largos períodos de tiempo sin comer.  · Consuma alimentos ricos en fibra, como frutas frescas, verduras, frijoles y cereales integrales. Consulte al nutricionista sobre cuántas porciones de carbohidratos puede consumir en cada comida.  · Consuma entre 4 y 6 onzas de proteínas magras por día, como carnes magras, pollo, pescado, huevos o tofu. 1 onza equivale a 1 onza de carne, pollo o pescado, 1 huevo, o 1/4 taza de tofu.  · Coma algunos alimentos por día que contengan grasas saludables, como aguacates, frutos secos, semillas y pescado.  Estilo de vida     · Controle su nivel de glucemia con regularidad.  · Haga ejercicio al menos 30 minutos, 5 días o más por semana, o como se lo haya indicado el médico.  · Tome los medicamentos como se lo haya indicado el médico.  · No consuma ningún producto que contenga nicotina o tabaco, como cigarrillos y cigarrillos electrónicos. Si necesita ayuda para dejar de fumar, consulte al médico.  · Trabaje con un asesor o instructor en diabetes para identificar estrategias para controlar el estrés y cualquier desafío emocional y social.  ¿Cuáles son algunas de las preguntas que puedo hacerle a mi médico?  · ¿Es necesario que me reúna con un instructor en diabetes?  · ¿Es necesario que me reúna con un nutricionista?  · ¿A qué número puedo llamar si tengo preguntas?  · ¿Cuáles son los mejores momentos para controlar la glucemia?  Dónde encontrar más información:  · Asociación Americana de la Diabetes (American Diabetes Association): diabetes.org/food-and-fitness/food  · Academia de Nutrición y Dietética (Academy of Nutrition and Dietetics): www.eatright.org/resources/health/diseases-and-conditions/diabetes  · Instituto Nacional de la Diabetes y las Enfermedades Digestivas y Renales (National Institute of Diabetes and Digestive and Kidney Diseases) (Institutos Nacionales de Salud, NIH): www.niddk.nih.gov/health-information/diabetes/overview/diet-eating-physical-activity  Resumen  · Un plan de alimentación saludable   lo ayudará a controlar la glucemia y mantener un estilo de vida saludable.  · Trabajar con un especialista en dietas y nutrición (nutricionista) puede ayudarlo a elaborar el mejor plan de alimentación para usted.  · Tenga en cuenta que los carbohidratos y el alcohol tienen efectos inmediatos en sus niveles de glucemia. Es importante contar los carbohidratos y consumir alcohol con prudencia.  Esta información no tiene como fin reemplazar el consejo del médico. Asegúrese de hacerle al médico cualquier pregunta que tenga.   Document Released: 01/01/2008 Document Revised: 01/14/2017 Document Reviewed: 01/14/2017  Elsevier Interactive Patient Education © 2018 Elsevier Inc.

## 2018-01-10 ENCOUNTER — Other Ambulatory Visit: Payer: Self-pay | Admitting: Gastroenterology

## 2018-01-10 DIAGNOSIS — R911 Solitary pulmonary nodule: Secondary | ICD-10-CM

## 2018-01-10 NOTE — Assessment & Plan Note (Signed)
We reviewed diet and exercise for diabetics at length.  She has had some improvement in her A1c.  We will see her back in 3 months and hope that she continues to improve.

## 2018-01-10 NOTE — Progress Notes (Signed)
    CHIEF COMPLAINT / HPI: Follow-up diabetes mellitus.  He is taking her long-acting insulin in the morning.  Short acting insulin with her dinner only.  This is been a better regimen for her because she is up with it.  She is try to have better dietary choices.  She is not exercising regularly. 2.  Needs refill on the yeast medication as she continues to get those regularly and she does not keep on the regimen.  REVIEW OF SYSTEMS: She continues to have a lot of fatigue.  No unusual episodes of low blood sugar.  No increased burning on urination.  PERTINENT  PMH / PSH: I have reviewed the patient's medications, allergies, past medical and surgical history, smoking status and updated in the EMR as appropriate.   OBJECTIVE: Vital signs reviewed. GENERAL: Well-developed, well-nourished, no acute distress. CARDIOVASCULAR: Regular rate and rhythm no murmur gallop or rub LUNGS: Clear to auscultation bilaterally, no rales or wheeze. ABDOMEN: Soft positive bowel sounds NEURO: No gross focal neurological deficits. MSK: Movement of extremity x 4.    ASSESSMENT / PLAN: Please see problem oriented charting for details

## 2018-01-15 ENCOUNTER — Ambulatory Visit: Payer: No Typology Code available for payment source | Admitting: Family Medicine

## 2018-01-20 ENCOUNTER — Ambulatory Visit
Admission: RE | Admit: 2018-01-20 | Discharge: 2018-01-20 | Disposition: A | Discharge status: Home / Self Care | Payer: Self-pay | Source: Ambulatory Visit | Attending: Gastroenterology | Admitting: Gastroenterology

## 2018-01-20 DIAGNOSIS — R911 Solitary pulmonary nodule: Secondary | ICD-10-CM

## 2018-02-06 ENCOUNTER — Ambulatory Visit: Payer: No Typology Code available for payment source | Admitting: Student

## 2018-02-19 ENCOUNTER — Ambulatory Visit: Payer: No Typology Code available for payment source | Admitting: Family Medicine

## 2018-07-22 ENCOUNTER — Other Ambulatory Visit: Payer: Self-pay | Admitting: Family Medicine

## 2018-10-07 IMAGING — US US ABDOMEN COMPLETE
1 series · 14 of 25 positions shown · non-contrast
Comparison: None.

CLINICAL DATA: Left upper quadrant pain

EXAM:
ABDOMEN ULTRASOUND COMPLETE

[Series 1: us abdomen complete · 0.21mm/px · 14 of 101 slices shown]
[im 1/101]
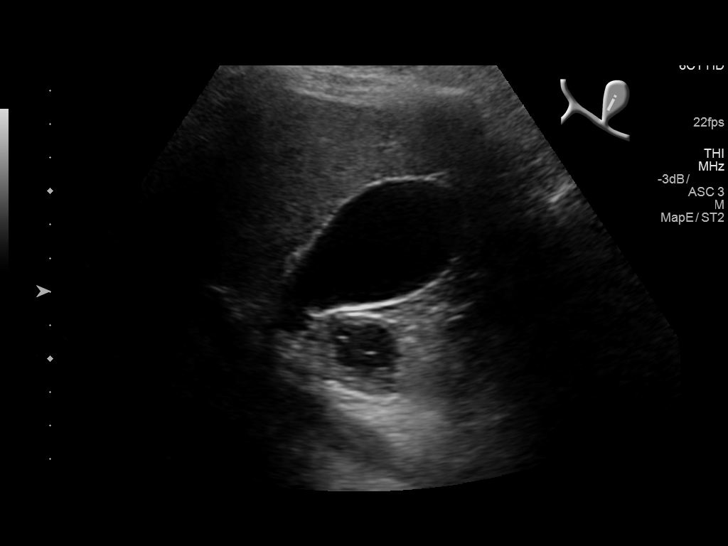
[im 9/101]
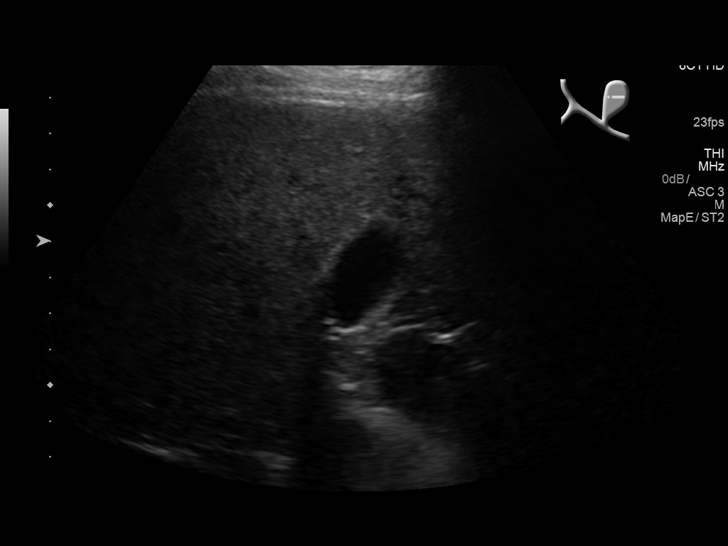
[im 17/101]
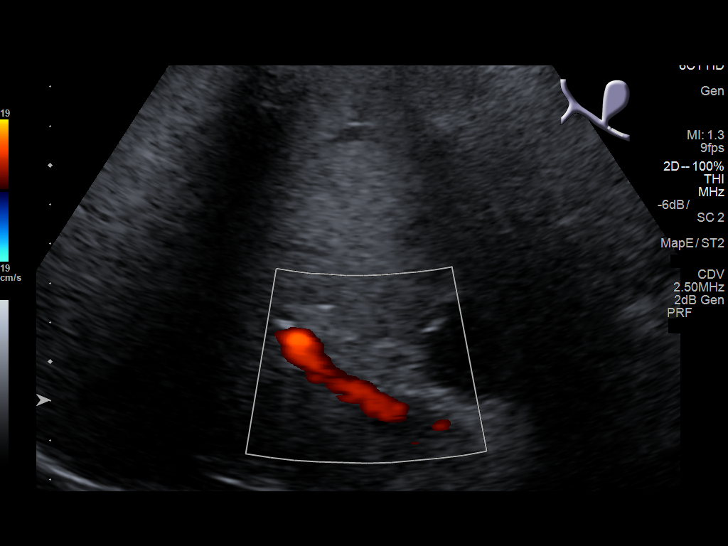
[im 26/101]
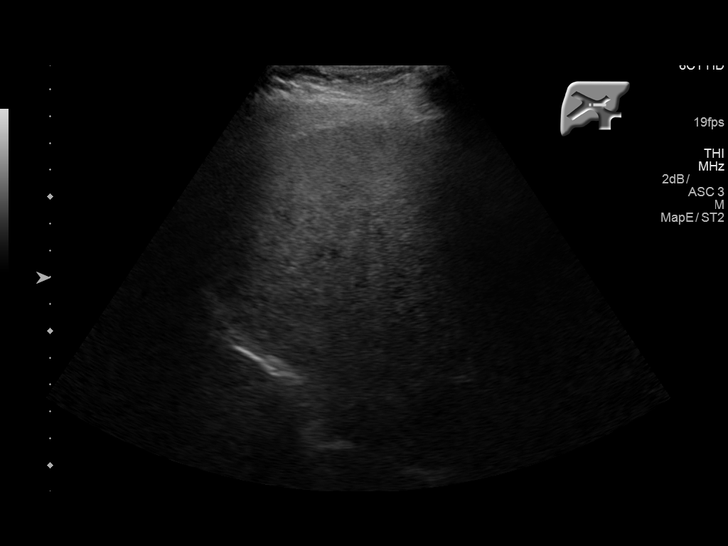
[im 34/101]
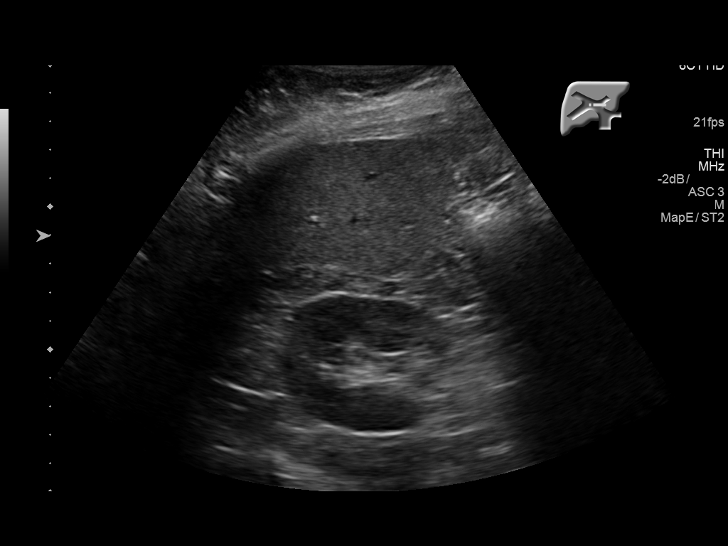
[im 38/101]
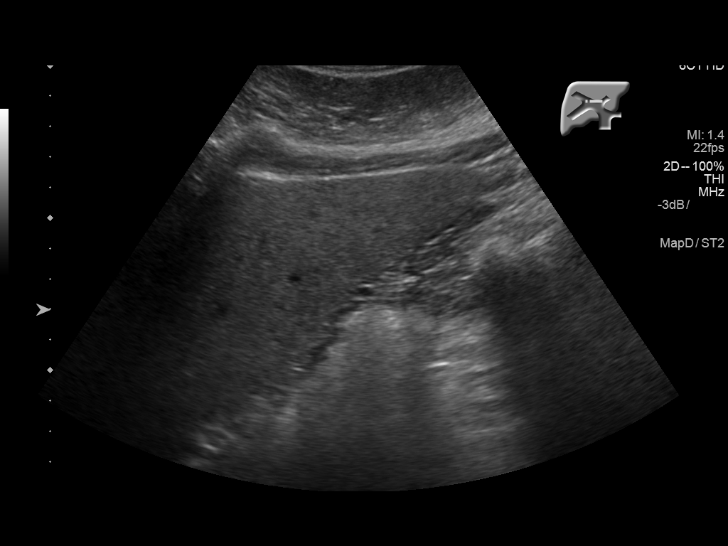
[im 46/101]
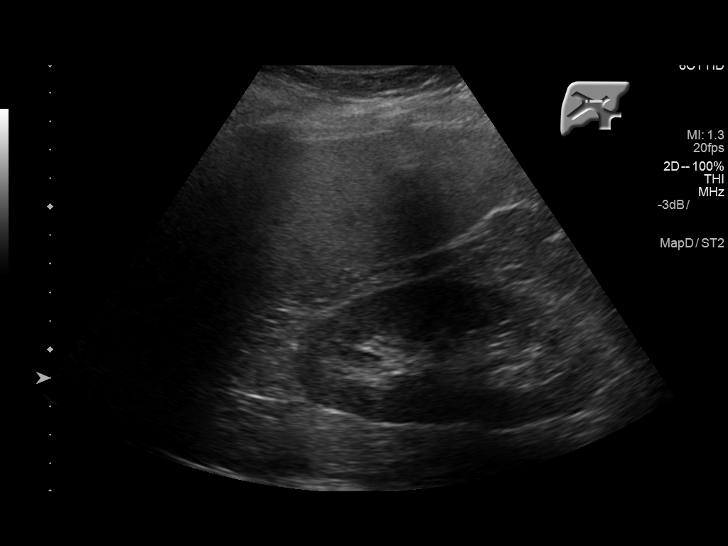
[im 55/101]
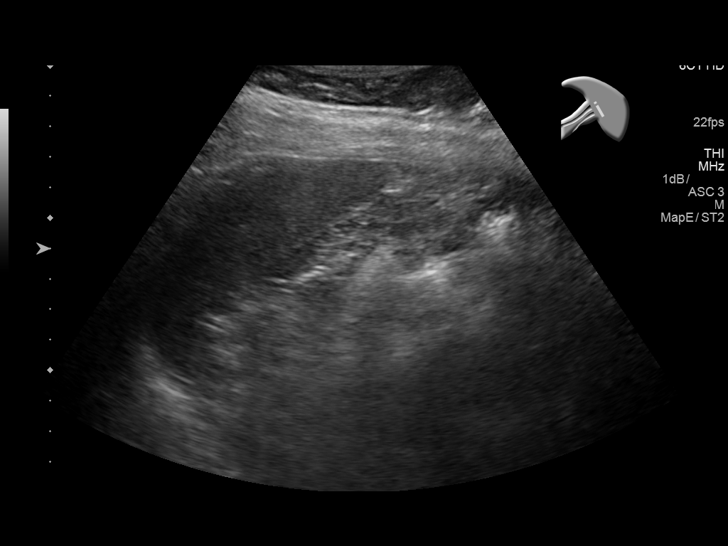
[im 63/101]
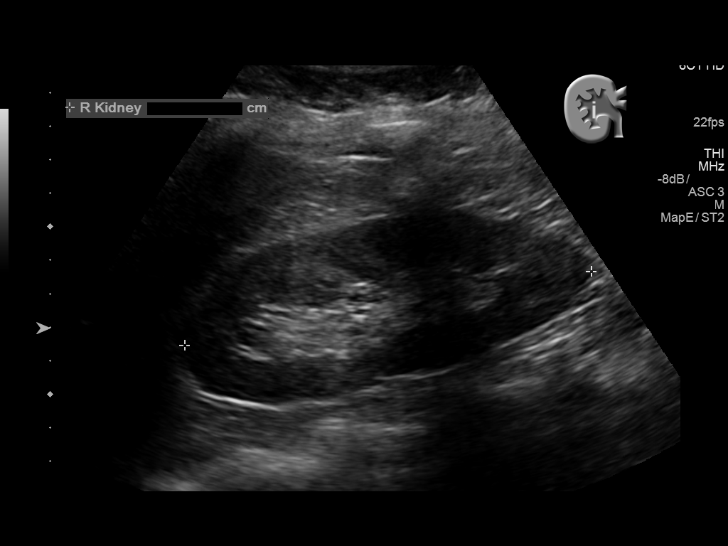
[im 67/101]
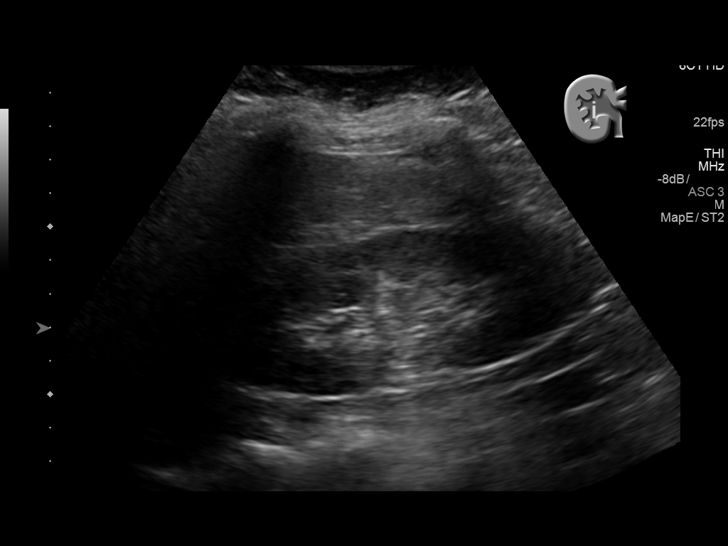
[im 76/101]
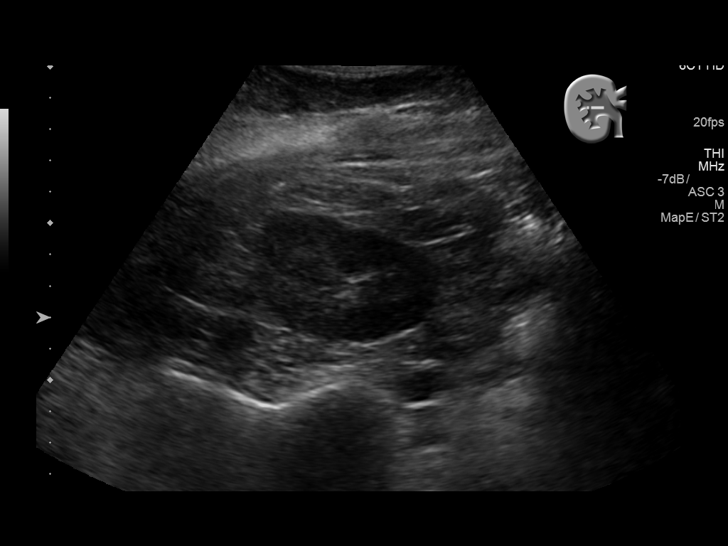
[im 84/101]
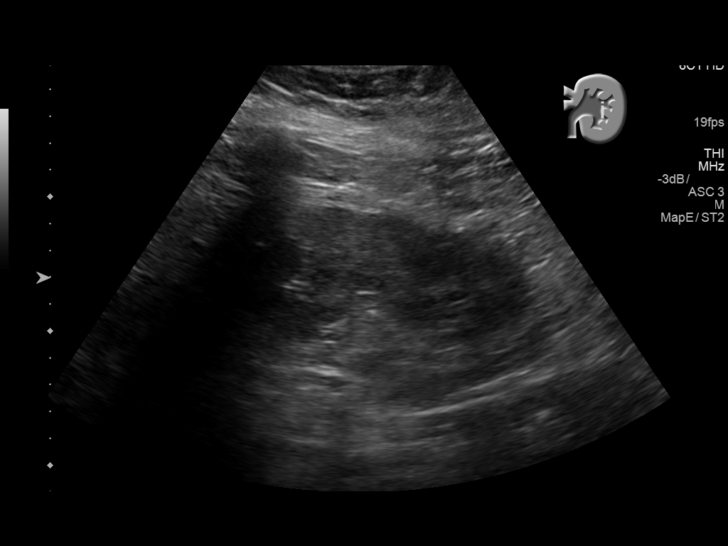
[im 92/101]
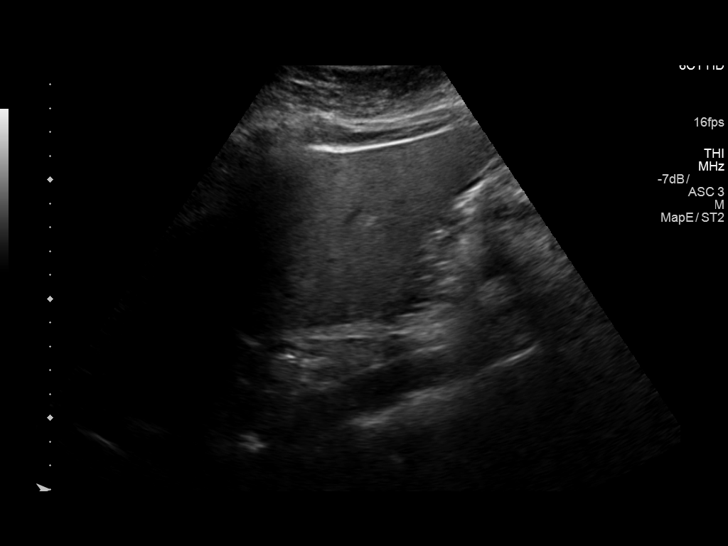
[im 101/101]
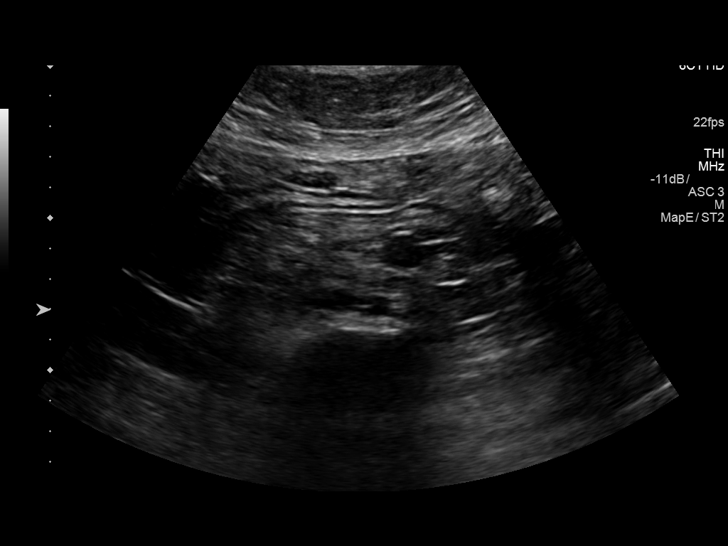

[14 of 25 positions shown; findings below may reference images not displayed]

FINDINGS: Gallbladder: No gallstones or wall thickening. 5 mm posterior
gallbladder wall polyp.

Common bile duct: Diameter: Normal caliber, 3 mm

Liver: Increased echotexture compatible with fatty infiltration. No
focal abnormality or biliary ductal dilatation.

IVC: No abnormality visualized.

Pancreas: Visualized portion unremarkable.

Spleen: Size and appearance within normal limits.

Right Kidney: Length: 12.3 cm. Echogenicity within normal limits. No
mass or hydronephrosis visualized.

Left Kidney: Length: 12.5 cm. Echogenicity within normal limits. No
mass or hydronephrosis visualized.

Abdominal aorta: No aneurysm visualized.

Other findings: None.
IMPRESSION: Small gallbladder wall polyp. No stones or evidence of
cholecystitis.

Fatty liver.

## 2018-12-25 ENCOUNTER — Other Ambulatory Visit: Payer: Self-pay | Admitting: Family Medicine

## 2018-12-31 ENCOUNTER — Encounter: Payer: No Typology Code available for payment source | Admitting: Family Medicine

## 2019-01-15 ENCOUNTER — Other Ambulatory Visit: Payer: Self-pay | Admitting: Family Medicine

## 2019-01-15 DIAGNOSIS — E118 Type 2 diabetes mellitus with unspecified complications: Secondary | ICD-10-CM

## 2019-01-15 DIAGNOSIS — Z794 Long term (current) use of insulin: Principal | ICD-10-CM

## 2019-02-03 ENCOUNTER — Other Ambulatory Visit: Payer: Self-pay | Admitting: Family Medicine

## 2019-03-05 ENCOUNTER — Other Ambulatory Visit: Payer: Self-pay | Admitting: Family Medicine

## 2019-03-16 ENCOUNTER — Other Ambulatory Visit: Payer: Self-pay | Admitting: Family Medicine

## 2019-05-15 ENCOUNTER — Other Ambulatory Visit: Payer: Self-pay

## 2019-05-18 MED ORDER — FLUCONAZOLE 150 MG PO TABS: ORAL_TABLET | ORAL | 3 refills | Status: DC

## 2019-07-04 IMAGING — MG 2D DIGITAL DIAGNOSTIC UNILATERAL RIGHT MAMMOGRAM WITH CAD AND AD
6 series · 6 of 14 positions shown · non-contrast
Comparison: Previous exam(s).

CLINICAL DATA: 46-year-old female recalled from screening mammogram
dated 05/15/2017 for a possible right breast mass.

EXAM:
2D DIGITAL DIAGNOSTIC UNILATERAL RIGHT MAMMOGRAM WITH CAD AND
ADJUNCT TOMO

[R MLO]
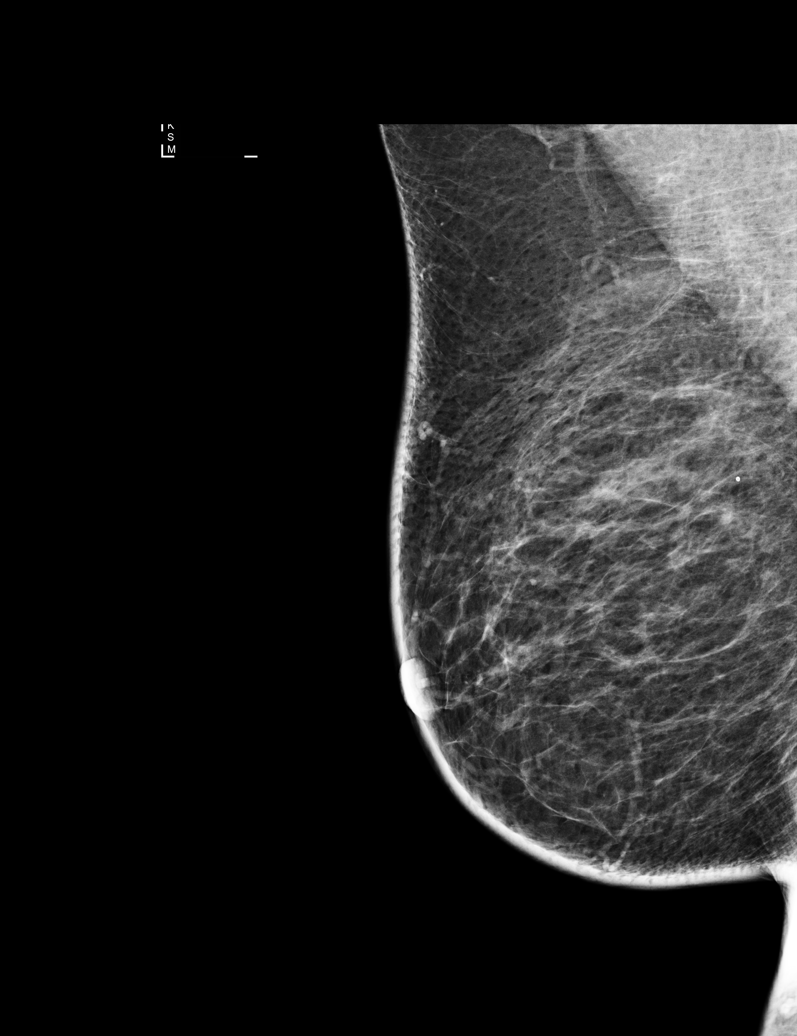

[R CC synth-2D]
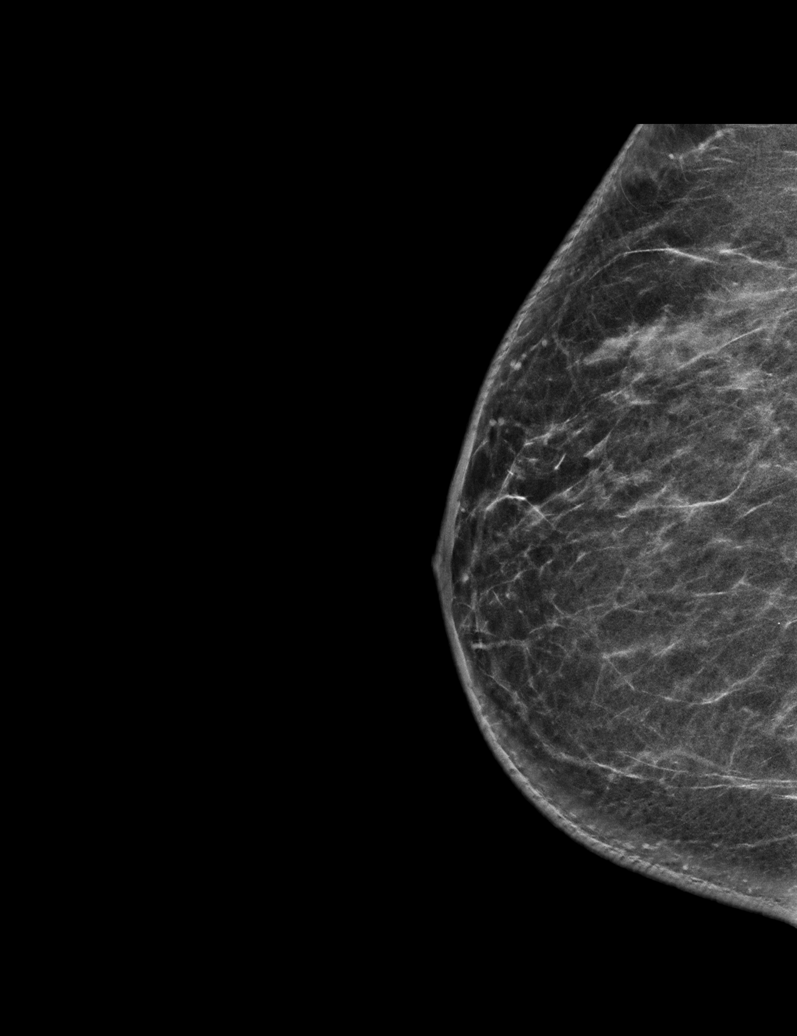

[R CC]
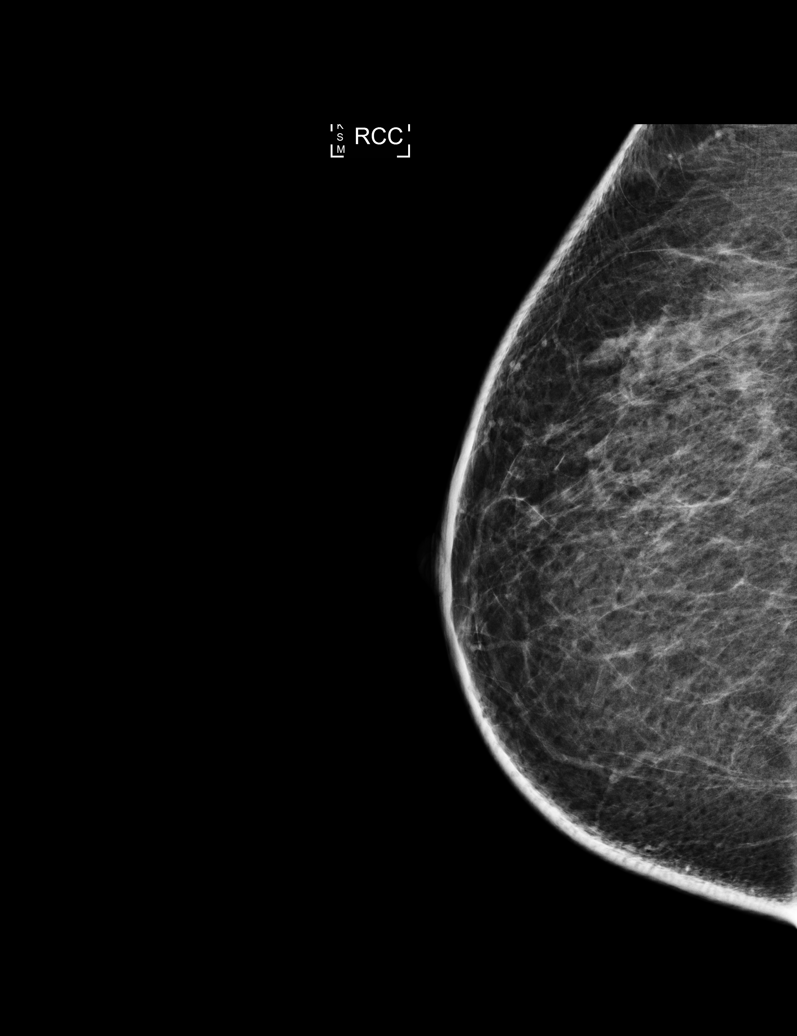

[R MLO synth-2D]
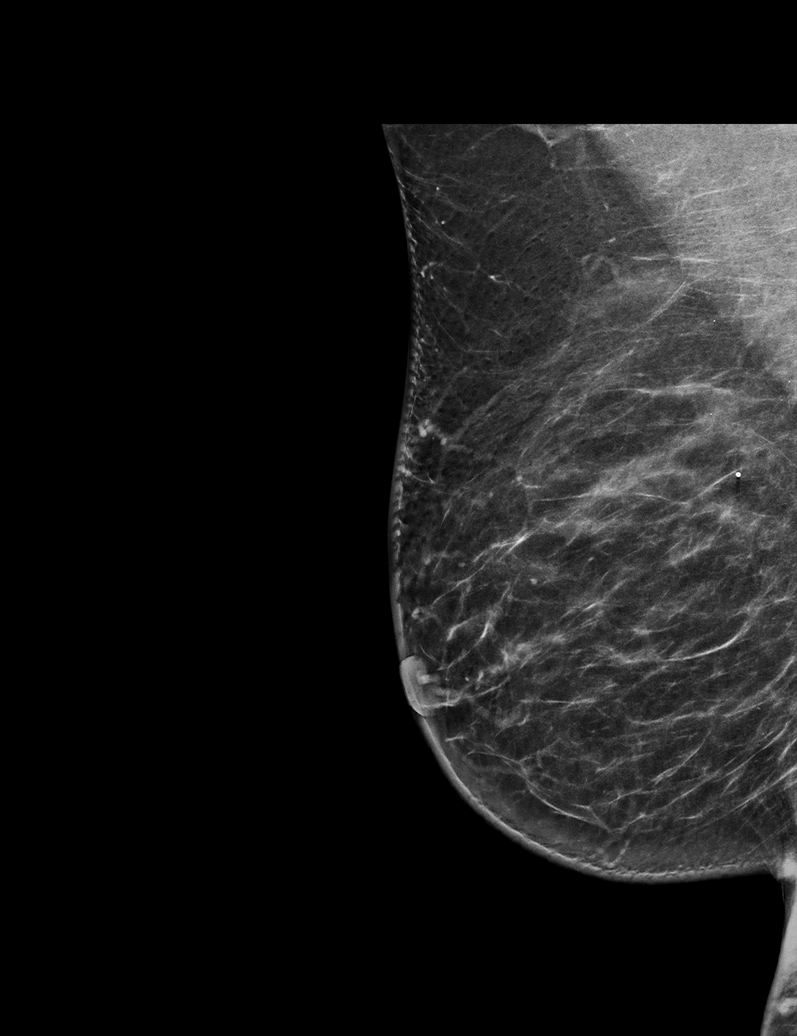

[R MLO tomo · tomo slice 37/73.0]
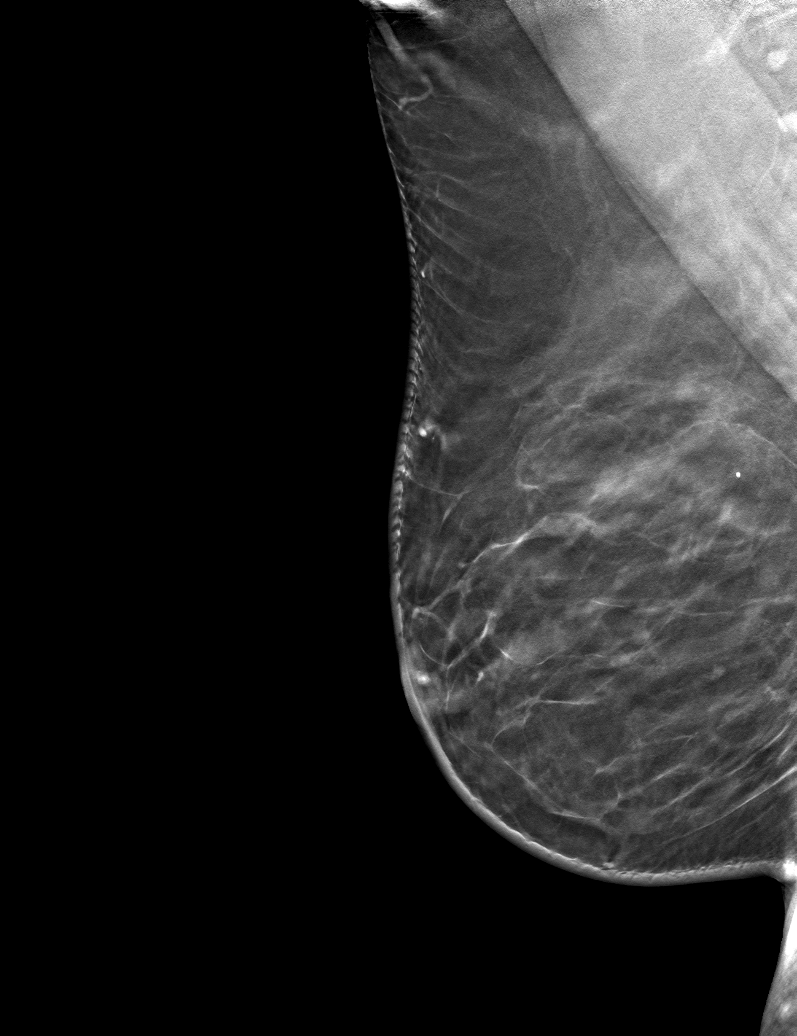

[R CC tomo · tomo slice 36/71.0]
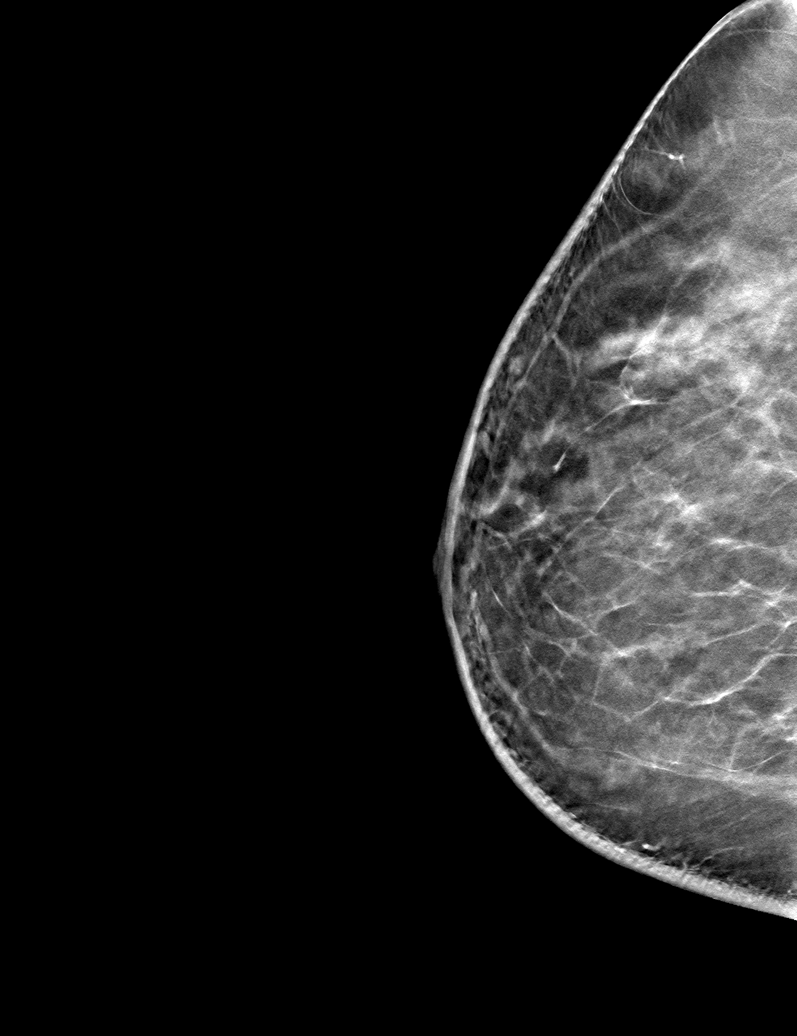

[6 of 14 positions shown; findings below may reference images not displayed]

ACR Breast Density Category b: There are scattered areas of
fibroglandular density.
FINDINGS: The previously described questionable mass in the superior left
breast at far posterior depth on the MLO projection resolves into
well dispersed fibroglandular tissue on today's additional 3D views.
No suspicious masses or calcifications are identified.

Mammographic images were processed with CAD.
IMPRESSION: No mammographic evidence of malignancy.

RECOMMENDATION:
Screening mammogram in one year.(Code:[id number])

I have discussed the findings and recommendations with the patient.
Results were also provided in writing at the conclusion of the
visit. If applicable, a reminder letter will be sent to the patient
regarding the next appointment.

BI-RADS CATEGORY  1: Negative.

## 2019-09-01 ENCOUNTER — Other Ambulatory Visit: Payer: Self-pay | Admitting: Family Medicine

## 2019-09-01 DIAGNOSIS — E118 Type 2 diabetes mellitus with unspecified complications: Secondary | ICD-10-CM

## 2019-09-01 DIAGNOSIS — Z794 Long term (current) use of insulin: Secondary | ICD-10-CM

## 2020-01-08 ENCOUNTER — Ambulatory Visit: Payer: No Typology Code available for payment source | Attending: Internal Medicine

## 2020-01-08 DIAGNOSIS — Z23 Encounter for immunization: Secondary | ICD-10-CM

## 2020-01-08 NOTE — Progress Notes (Signed)
   Covid-19 Vaccination Clinic  Name:  Rebecca Terrell    MRN: [id number] DOB: March 22, 1971  01/08/2020  [name] was observed post Covid-19 immunization for 15 minutes without incident. She was provided with Vaccine Information Sheet and instruction to access the V-Safe system.   [name] was instructed to call 911 with any severe reactions post vaccine: Marland Kitchen Difficulty breathing  . Swelling of face and throat  . A fast heartbeat  . A bad rash all over body  . Dizziness and weakness   Immunizations Administered    Name Date Dose VIS Date Route   Pfizer COVID-19 Vaccine 01/08/2020  4:59 PM 0.3 mL 09/18/2019 Intramuscular   Manufacturer: [employer], [employer]   Lot: [id number]   NDC: [address]-6

## 2020-01-13 ENCOUNTER — Ambulatory Visit (INDEPENDENT_AMBULATORY_CARE_PROVIDER_SITE_OTHER): Payer: No Typology Code available for payment source | Admitting: Family Medicine

## 2020-01-13 ENCOUNTER — Other Ambulatory Visit: Payer: Self-pay

## 2020-01-13 ENCOUNTER — Encounter: Payer: Self-pay | Admitting: Family Medicine

## 2020-01-13 VITALS — BP 136/82 | HR 77 | Ht 62.0 in | Wt 176.8 lb

## 2020-01-13 DIAGNOSIS — E119 Type 2 diabetes mellitus without complications: Secondary | ICD-10-CM

## 2020-01-13 DIAGNOSIS — R300 Dysuria: Secondary | ICD-10-CM

## 2020-01-13 DIAGNOSIS — E1165 Type 2 diabetes mellitus with hyperglycemia: Secondary | ICD-10-CM

## 2020-01-13 LAB — POCT URINALYSIS DIP (MANUAL ENTRY)
Bilirubin, UA: NEGATIVE
Blood, UA: NEGATIVE
Glucose, UA: 500 mg/dL — AB
Ketones, POC UA: NEGATIVE mg/dL
Leukocytes, UA: NEGATIVE
Nitrite, UA: NEGATIVE
Protein Ur, POC: NEGATIVE mg/dL
Spec Grav, UA: 1.025 (ref 1.010–1.025)
Urobilinogen, UA: 0.2 E.U./dL
pH, UA: 5.5 (ref 5.0–8.0)

## 2020-01-13 LAB — POCT GLYCOSYLATED HEMOGLOBIN (HGB A1C): HbA1c, POC (controlled diabetic range): 11.6 % — AB (ref 0.0–7.0)

## 2020-01-13 MED ORDER — APIDRA 100 UNIT/ML IJ SOLN: INTRAMUSCULAR | 12 refills | Status: DC

## 2020-01-13 MED ORDER — INSULIN GLARGINE 100 UNIT/ML ~~LOC~~ SOLN: SUBCUTANEOUS | 12 refills | Status: DC

## 2020-01-13 MED ORDER — CEPHALEXIN 500 MG PO CAPS: 500.0000 mg | ORAL_CAPSULE | Freq: Three times a day (TID) | ORAL | 0 refills | Status: DC

## 2020-01-13 NOTE — Patient Instructions (Signed)
Take the antibiotic for 2 weeks. REALLY watch you blood sugar and what you eat. Your A1C needs to be between 7 and 7.5. You are at 11.6. If you continue at this rate, you will likely end up on dialysis for kidney failure and you may have vision difficulties. Elevated blood sugars in this range also put you at risk for amputation, stroke and heart attack.  Let me see you in 3 months and I hope your A1C is much better by then. You CAN do this!

## 2020-01-14 MED ORDER — FLUCONAZOLE 150 MG PO TABS: ORAL_TABLET | ORAL | 3 refills | Status: DC

## 2020-01-14 NOTE — Progress Notes (Signed)
    CHIEF COMPLAINT / HPI: #1.  Diabetes mellitus: Has not been checking her sugars.  She has been taking her long-acting insulin but not really taking her mealtime insulin regularly at all.  She is occasionally taking her Metformin.  She is having increased urinary frequency.  Also having some pelvic pressure with that.  No blood in urine.  PERTINENT  PMH / PSH: I have reviewed the patient's medications, allergies, past medical and surgical history, smoking status and updated in the EMR as appropriate.   OBJECTIVE:  BP 136/82   Pulse 77   Ht 5\' 2"  (1.575 m)   Wt 176 lb 12.8 oz (80.2 kg)   SpO2 100%   BMI 32.34 kg/m  GENERAL: Well developed, well nourished and no acute distress. RESPIRATORY: respiratory rate and effort normal CARDIOVASCULAR: RRR. MSK: Normal gait, normal muscle bulk and tone.  PSYCH: Alert and oriented. Normal speech fluency and content. Asks and answers questions appropriately. No agitation noted. NEURO: no gross focal motor deficits are noted. Abdomen: Soft, nontender.  ASSESSMENT / PLAN:   DM (diabetes mellitus), type 2, uncontrolled A1c greater than 11.  Long discussion with her.  Her daughter was also present and helped interpret although formal interpreter was used for the early part of the visit.  Per patient request, will switch to her daughter.  I will go ahead and treat her with antibiotic for urine although it is most likely related to her glucosuria.  She absolutely has to get control of her diabetes.   MD

## 2020-01-14 NOTE — Assessment & Plan Note (Signed)
A1c greater than 11.  Long discussion with her.  Her daughter was also present and helped interpret although formal interpreter was used for the early part of the visit.  Per patient request, will switch to her daughter.  I will go ahead and treat her with antibiotic for urine although it is most likely related to her glucosuria.  She absolutely has to get control of her diabetes.

## 2020-02-02 ENCOUNTER — Ambulatory Visit: Payer: No Typology Code available for payment source | Attending: Internal Medicine

## 2020-02-02 DIAGNOSIS — Z23 Encounter for immunization: Secondary | ICD-10-CM

## 2020-02-02 NOTE — Progress Notes (Signed)
   Covid-19 Vaccination Clinic  Name:  Rebecca Terrell    MRN: [id number] DOB: 12-18-1970  02/02/2020  Rebecca Terrell was observed post Covid-19 immunization for 15 minutes without incident. She was provided with Vaccine Information Sheet and instruction to access the V-Safe system.   Rebecca Terrell was instructed to call 911 with any severe reactions post vaccine: Marland Kitchen Difficulty breathing  . Swelling of face and throat  . A fast heartbeat  . A bad rash all over body  . Dizziness and weakness   Immunizations Administered    Name Date Dose VIS Date Route   Pfizer COVID-19 Vaccine 02/02/2020  4:53 PM 0.3 mL 12/02/2018 Intramuscular   Manufacturer: [employer], [employer]   Lot: [id number]   NDC: [address]-5

## 2020-06-03 ENCOUNTER — Other Ambulatory Visit: Payer: Self-pay | Admitting: Family Medicine

## 2020-06-06 ENCOUNTER — Telehealth: Payer: Self-pay | Admitting: Family Medicine

## 2020-06-06 DIAGNOSIS — E1165 Type 2 diabetes mellitus with hyperglycemia: Secondary | ICD-10-CM

## 2020-06-06 NOTE — Telephone Encounter (Signed)
Rebecca Terrell patients friend is calling for patient. Patient is needing dental procedure done this week and they would like her a1c checked. I offered her an appointment this week and patient said she prefers to only see Dr. Jennette Kettle. Dr. Jennette Kettle has no appointments available.   Patient would like to know if Dr. Jennette Kettle can place order for her a1c to be checked in the lab and then call back to schedule with Dr. Jennette Kettle in October.   Patient would like to have this done this week and would like for someone to call her if this is able to be done.   The best call back for Rebecca Terrell is [phone].

## 2020-06-06 NOTE — Telephone Encounter (Signed)
Dear Cliffton Asters Team I put in for an A1c. I did not put it in as a point of care but if that would be better, maybe you can change it. Please let her know THANKS! Denny Levy

## 2020-06-07 ENCOUNTER — Other Ambulatory Visit: Payer: Self-pay

## 2020-06-07 ENCOUNTER — Other Ambulatory Visit: Payer: No Typology Code available for payment source

## 2020-06-07 ENCOUNTER — Other Ambulatory Visit (INDEPENDENT_AMBULATORY_CARE_PROVIDER_SITE_OTHER): Payer: No Typology Code available for payment source

## 2020-06-07 DIAGNOSIS — E1165 Type 2 diabetes mellitus with hyperglycemia: Secondary | ICD-10-CM

## 2020-06-07 LAB — POCT GLYCOSYLATED HEMOGLOBIN (HGB A1C): HbA1c, POC (controlled diabetic range): 10.8 % — AB (ref 0.0–7.0)

## 2020-06-07 NOTE — Telephone Encounter (Signed)
A1C was completed today. Sunday Spillers, CMA

## 2020-06-10 ENCOUNTER — Ambulatory Visit
Payer: No Typology Code available for payment source | Admitting: Student in an Organized Health Care Education/Training Program

## 2020-09-14 ENCOUNTER — Other Ambulatory Visit: Payer: Self-pay

## 2020-09-14 ENCOUNTER — Encounter: Payer: Self-pay | Admitting: Family Medicine

## 2020-09-14 ENCOUNTER — Ambulatory Visit (INDEPENDENT_AMBULATORY_CARE_PROVIDER_SITE_OTHER): Payer: No Typology Code available for payment source | Admitting: Family Medicine

## 2020-09-14 VITALS — BP 138/76 | HR 69 | Ht 62.0 in | Wt 164.0 lb

## 2020-09-14 DIAGNOSIS — E119 Type 2 diabetes mellitus without complications: Secondary | ICD-10-CM

## 2020-09-14 DIAGNOSIS — Z23 Encounter for immunization: Secondary | ICD-10-CM

## 2020-09-14 DIAGNOSIS — Z299 Encounter for prophylactic measures, unspecified: Secondary | ICD-10-CM

## 2020-09-14 DIAGNOSIS — L03818 Cellulitis of other sites: Secondary | ICD-10-CM

## 2020-09-14 DIAGNOSIS — E1165 Type 2 diabetes mellitus with hyperglycemia: Secondary | ICD-10-CM

## 2020-09-14 DIAGNOSIS — I10 Essential (primary) hypertension: Secondary | ICD-10-CM

## 2020-09-14 LAB — POCT GLYCOSYLATED HEMOGLOBIN (HGB A1C): Hemoglobin A1C: 11.6 % — AB (ref 4.0–5.6)

## 2020-09-14 MED ORDER — CEPHALEXIN 500 MG PO CAPS: 500.0000 mg | ORAL_CAPSULE | Freq: Four times a day (QID) | ORAL | 0 refills | Status: DC

## 2020-09-14 NOTE — Assessment & Plan Note (Signed)
Long history of extremely poor control.  I reviewed with her the insulin regimen which she is taking the long-acting insulin but I think she only takes the short acting insulin with her dinner.  She is not following any type of specific diet.  We will see if I can get her into pharmacy clinic to see if we need to just increase her insulin or add additional agent.  Cost is a significant issue for her.  Limited understanding of diabetes mellitus as well.

## 2020-09-14 NOTE — Progress Notes (Signed)
    CHIEF COMPLAINT / HPI: Interpreter used for entire visit: Velasquez #1.  Follow-up diabetes mellitus: She does not check her blood sugars.  She is not been following a specific diet.  She has had no episodes of low blood sugar and no unusual increase in thirst or frequency of urination.  She is having increasing numbness and tingling in bilateral hands. #2.  She has a cyst on her chest between her breasts.  It popped up several weeks ago and is gradually getting better but it is very painful.  It has not been draining anything.  She has not had her recent mammogram. #3.  Preventive health maintenance: She had a normal Pap smear in 2017.  She wonders when she needs to get another one.  She has had a mammogram in the past but not in the last year. 4.  Follow-up high blood pressure: No chest pains, no shortness of breath.  No problems with medicines.   PERTINENT  PMH / PSH: I have reviewed the patient's medications, allergies, past medical and surgical history, smoking status and updated in the EMR as appropriate. Poorly controlled diabetes mellitus long-term Financial constraints  OBJECTIVE:  BP 138/76   Pulse 69   Ht 5\' 2"  (1.575 m)   Wt 164 lb (74.4 kg)   SpO2 99%   BMI 30.00 kg/m  Vital signs reviewed. GENERAL: Well-developed, well-nourished, no acute distress. CARDIOVASCULAR: Regular rate and rhythm no murmur gallop or rub LUNGS: Clear to auscultation bilaterally, no rales or wheeze. ABDOMEN: Soft positive bowel sounds NEURO: No gross focal neurological deficits.  She has intact sensation to soft touch and pinprick bilateral hands. MSK: Movement of extremity x 4. SKIN: In the area between her breasts on the chest is a 1.25 cm mass, mildly tender to palpation.  Nonfluctuant.  No surrounding erythema.  Seems to be localized within the subdermal area of the skin is not connected to breast tissue.    ASSESSMENT / PLAN: #1.  Cellulitis/sebaceous cyst that is inflamed or infected.   I will treat her with 4 times daily cephalexin for 5 days.  Follow-up tomorrow if we can and she is not having some improvement, will consider incision and drainage.  We have been able to get her an appointment tomorrow for pharmacy clinic.  DM (diabetes mellitus), type 2, uncontrolled Long history of extremely poor control.  I reviewed with her the insulin regimen which she is taking the long-acting insulin but I think she only takes the short acting insulin with her dinner.  She is not following any type of specific diet.  We will see if I can get her into pharmacy clinic to see if we need to just increase her insulin or add additional agent.  Cost is a significant issue for her.  Limited understanding of diabetes mellitus as well.  HYPERTENSION, BENIGN SYSTEMIC Appropriate control.  We will continue current medications and check creatinine, potassium today.  Preventive measure I gave her information on setting up her mammogram and colonoscopy however she is now tells me that she does not have the discount program any longer and she is not sure she will be insured.  Either way I need to follow her up in February and we can explore this more at that appointment.  Getting her blood sugar under control at this moment is the most important thing.   March MD

## 2020-09-14 NOTE — Assessment & Plan Note (Signed)
Appropriate control.  We will continue current medications and check creatinine, potassium today.

## 2020-09-14 NOTE — Assessment & Plan Note (Addendum)
I gave her information on setting up her mammogram and colonoscopy however she is now tells me that she does not have the discount program any longer and she is not sure she will be insured.  Either way I need to follow her up in February and we can explore this more at that appointment.  Getting her blood sugar under control at this moment is the most important thing.

## 2020-09-14 NOTE — Patient Instructions (Signed)
I am placing you on an antibiotic. When you come in tomorrow to see Dr. Raymondo Band, tell him that I want to also take a quick look at the cyst on your chest. If it is getting better then we will have to do anything else with it.  I really want you to see Dr. Raymondo Band because your A1c remains quite high. I am not can make any changes to your medicines today since you will be able to see him tomorrow and I really want his input.  I have given you handouts on colonoscopy and mammogram and I think you will be eligible for the [facility] discount for both of those. Please set those up at your convenience. I would like to see you back in regular follow-up in early February next year. Have a great holiday season!

## 2020-09-15 ENCOUNTER — Ambulatory Visit: Payer: No Typology Code available for payment source | Admitting: Pharmacist

## 2020-09-15 LAB — COMPREHENSIVE METABOLIC PANEL
ALT: 13 IU/L (ref 0–32)
AST: 10 IU/L (ref 0–40)
Albumin/Globulin Ratio: 1.1 — ABNORMAL LOW (ref 1.2–2.2)
Albumin: 4.1 g/dL (ref 3.8–4.8)
Alkaline Phosphatase: 133 IU/L — ABNORMAL HIGH (ref 44–121)
BUN/Creatinine Ratio: 25 — ABNORMAL HIGH (ref 9–23)
BUN: 15 mg/dL (ref 6–24)
Bilirubin Total: 0.4 mg/dL (ref 0.0–1.2)
CO2: 21 mmol/L (ref 20–29)
Calcium: 10.2 mg/dL (ref 8.7–10.2)
Chloride: 97 mmol/L (ref 96–106)
Creatinine, Ser: 0.59 mg/dL (ref 0.57–1.00)
GFR calc Af Amer: 124 mL/min/{1.73_m2} (ref >59)
GFR calc non Af Amer: 107 mL/min/{1.73_m2} (ref >59)
Globulin, Total: 3.8 g/dL (ref 1.5–4.5)
Glucose: 257 mg/dL — ABNORMAL HIGH (ref 65–99)
Potassium: 4 mmol/L (ref 3.5–5.2)
Sodium: 135 mmol/L (ref 134–144)
Total Protein: 7.9 g/dL (ref 6.0–8.5)

## 2020-09-21 ENCOUNTER — Ambulatory Visit: Payer: No Typology Code available for payment source | Admitting: Family Medicine

## 2020-09-22 ENCOUNTER — Ambulatory Visit: Payer: No Typology Code available for payment source | Admitting: Pharmacist

## 2020-10-12 ENCOUNTER — Other Ambulatory Visit: Payer: Self-pay

## 2020-10-12 ENCOUNTER — Encounter: Payer: Self-pay | Admitting: Family Medicine

## 2020-10-12 ENCOUNTER — Ambulatory Visit (INDEPENDENT_AMBULATORY_CARE_PROVIDER_SITE_OTHER): Payer: No Typology Code available for payment source | Admitting: Family Medicine

## 2020-10-12 VITALS — BP 122/82 | HR 75 | Ht 62.0 in | Wt 162.0 lb

## 2020-10-12 DIAGNOSIS — N6082 Other benign mammary dysplasias of left breast: Secondary | ICD-10-CM

## 2020-10-12 DIAGNOSIS — L03818 Cellulitis of other sites: Secondary | ICD-10-CM

## 2020-10-12 DIAGNOSIS — E1165 Type 2 diabetes mellitus with hyperglycemia: Secondary | ICD-10-CM

## 2020-10-12 LAB — GLUCOSE, POCT (MANUAL RESULT ENTRY): POC Glucose: 268 mg/dl — AB (ref 70–99)

## 2020-10-13 ENCOUNTER — Ambulatory Visit: Payer: No Typology Code available for payment source | Admitting: Pharmacist

## 2020-10-13 NOTE — Assessment & Plan Note (Signed)
Will schedule her into pharmacy clinic for recommendations. Big part of her issues with non compliance is her limited understanding of a chronic disease state. She is also limited by language and will need translator. Additional limiting factors are financial and dietary.\\I tried at last visit to simplify her regimen to only long acting insulin and we seemed to make some moderate improvement. I am not sure meal time insulin is something she will easily adapt to.

## 2020-10-13 NOTE — Progress Notes (Signed)
    CHIEF COMPLAINT / HPI: 1.  f/u poorly controlled DM 2. Recheck on sebaceous cyst left breat s/p antibiotic treatment  1. DM: long history of poor control. (see my last office note). I had scheduled her in pharmacy clinic hoping they could re-evaluate her current regimen and potent\ially offer some much needed eduation but she cancelled the appt. She is not checking sugars and not taking her meal time insulin. At last office visit I emphasized her daily long actib]=ng insulin which she had only been taking about twice a week and today's check was to see if that change occurred. Given her finger stick CBG today, I do not see a lot of improvement. She says she is trying to take med daily. 2. Cyst: she is very concerned about the sebaceous cyst. At last visit it was somewhat inflamed and I treated with a course of antibiotics which she completed. It is no longer painful and has shrunk in size. It concerns her greatly howver.   PERTINENT  PMH / PSH: I have reviewed the patient's medications, allergies, past medical and surgical history, smoking status and updated in the EMR as appropriate.   OBJECTIVE:  BP 122/82   Pulse 75   Ht 5\' 2"  (1.575 m)   Wt 162 lb (73.5 kg)   LMP 03/08/2020   SpO2 98%   BMI 29.63 kg/m   Vital signs reviewed. GENERAL: Well-developed, well-nourished, no acute distress. CARDIOVASCULAR: Regular rate and rhythm no murmur gallop or rub LUNGS: Clear to auscultation bilaterally, no rales or wheeze. ABDOMEN: Soft positive bowel sounds NEURO: No gross focal neurological deficits. MSK: Movement of extremity x 4. SKIN: left breast cleft is a small 0.8 cm relatively non-fluctuant and not firm subcutaneous cyst. No unusual erythema or warmth. No drainage. No induration. Non tender.  ASSESSMENT / PLAN:   DM (diabetes mellitus), type 2, uncontrolled Will schedule her into pharmacy clinic for recommendations. Big part of her issues with non compliance is her limited  understanding of a chronic disease state. She is also limited by language and will need translator. Additional limiting factors are financial and dietary.\\I tried at last visit to simplify her regimen to only long acting insulin and we seemed to make some moderate improvement. I am not sure meal time insulin is something she will easily adapt to.   2. Sebaceous cyst: no more sign of inflammation or infection and size significanllyt improved. She is very concerned about it however and contemplating removal despite my reassurances. I suspect it will continue to resolve although it may not totally disappear. We ultimately agreed to observe and f/u this in 2 months when I see her back for her other chronic issues. 3. Previous cellulitis infection around cyst has resolved. She thinks she needs to continue antibiotics until the cyst "goes away". See my comments under sebaceous cyst above. Total 35 minutes spent face to face is counseling regarding these two issues. 05/08/2020 MD

## 2020-12-12 ENCOUNTER — Other Ambulatory Visit: Payer: Self-pay | Admitting: Family Medicine

## 2020-12-12 DIAGNOSIS — E118 Type 2 diabetes mellitus with unspecified complications: Secondary | ICD-10-CM

## 2020-12-14 ENCOUNTER — Other Ambulatory Visit: Payer: Self-pay

## 2020-12-14 DIAGNOSIS — Z1231 Encounter for screening mammogram for malignant neoplasm of breast: Secondary | ICD-10-CM

## 2021-01-05 ENCOUNTER — Ambulatory Visit: Payer: No Typology Code available for payment source

## 2021-01-05 ENCOUNTER — Other Ambulatory Visit: Payer: Self-pay

## 2021-01-05 ENCOUNTER — Ambulatory Visit: Payer: Self-pay | Admitting: *Deleted

## 2021-01-05 ENCOUNTER — Encounter (INDEPENDENT_AMBULATORY_CARE_PROVIDER_SITE_OTHER): Payer: Self-pay

## 2021-01-05 VITALS — BP 122/82 | Wt 171.4 lb

## 2021-01-05 DIAGNOSIS — R2231 Localized swelling, mass and lump, right upper limb: Secondary | ICD-10-CM

## 2021-01-05 DIAGNOSIS — Z1239 Encounter for other screening for malignant neoplasm of breast: Secondary | ICD-10-CM

## 2021-01-05 NOTE — Patient Instructions (Signed)
Explained breast self awareness with Ahana Osornio-Lujan. Patient did not need a Pap smear today due to last Pap smear and HPV typing was 08/15/2016. Let her know BCCCP will cover Pap smears and HPV typing every 5 years unless has a history of abnormal Pap smears. Referred patient to the [facility] for a diagnostic mammogram. Appointment scheduled Friday, January 06, 2021 at 0830. Patient aware of appointment and will be there. Jin Osornio-Lujan verbalized understanding.  Glyn Zendejas, Kathaleen Maser, RN 12:57 PM

## 2021-01-05 NOTE — Progress Notes (Signed)
[name] is a 50 y.o. female who presents to [facility] clinic today with complaint of right axilla red, swollen, and painful. Patient states the pain started 3-4 days ago. Patient rates the pain at a 7-8 out of 10.    Pap Smear: Pap smear not completed today. Last Pap smear was 08/15/2016 at [facility] and normal with negative HPV. Per patient has no history of an abnormal Pap smear. Last Pap smear result is available in Epic.   Physical exam: Breasts Right breast slightly larger than left breast that is a change from her previous visit 07/02/2017.No skin abnormalities left breast. Right axilla red and swollen. No nipple retraction bilateral breasts. No nipple discharge bilateral breasts. No lymphadenopathy left axilla. Right axillary lymphadenopathy. No lumps palpated left breast. Palpated a 4.5 cm x 3 cm reddened lump within the right axilla at 10 o'clock 15 cm from the nipple. Complaints of pain when palpated right axillary lump.     Pelvic/Bimanual Pap is not indicated today per BCCCP guidelines.   Smoking History: Patient has never smoked.   Patient Navigation: Patient education provided. Access to services provided for patient through [place] program. Spanish interpreter Rebecca Terrell from [facility] provided.   Colorectal Cancer Screening: Per patient has never had colonoscopy completed. No complaints today.    Breast and Cervical Cancer Risk Assessment: Patient does not have family history of breast cancer, known genetic mutations, or radiation treatment to the chest before age 65. Patient does not have history of cervical dysplasia, immunocompromised, or DES exposure in-utero.  Risk Assessment    Risk Scores      01/05/2021   Last edited by: Rebecca Terrell, CMA   5-year risk: 0.6 %   Lifetime risk: 5.7 %          A: BCCCP exam without pap smear Complaint of right axillary redness, swelling, and pain.  P: Referred patient to the [facility] of  [facility] for a diagnostic mammogram. Appointment scheduled Friday, January 06, 2021 at 0830.  Rebecca Heidelberg, RN 01/05/2021 12:57 PM

## 2021-01-06 ENCOUNTER — Ambulatory Visit
Admission: RE | Admit: 2021-01-06 | Discharge: 2021-01-06 | Disposition: A | Discharge status: Home / Self Care | Payer: No Typology Code available for payment source | Source: Ambulatory Visit | Attending: Obstetrics and Gynecology | Admitting: Obstetrics and Gynecology

## 2021-01-06 ENCOUNTER — Other Ambulatory Visit: Payer: Self-pay | Admitting: Obstetrics and Gynecology

## 2021-01-06 DIAGNOSIS — R2231 Localized swelling, mass and lump, right upper limb: Secondary | ICD-10-CM

## 2021-01-10 ENCOUNTER — Other Ambulatory Visit: Payer: Self-pay | Admitting: Surgery

## 2021-01-17 ENCOUNTER — Other Ambulatory Visit: Payer: Self-pay | Admitting: Family Medicine

## 2021-02-20 ENCOUNTER — Other Ambulatory Visit: Payer: Self-pay | Admitting: Family Medicine

## 2021-03-21 ENCOUNTER — Other Ambulatory Visit: Payer: Self-pay | Admitting: Family Medicine

## 2021-04-12 ENCOUNTER — Other Ambulatory Visit: Payer: Self-pay | Admitting: Family Medicine

## 2021-05-11 ENCOUNTER — Other Ambulatory Visit: Payer: Self-pay | Admitting: Family Medicine

## 2021-06-06 ENCOUNTER — Other Ambulatory Visit: Payer: Self-pay | Admitting: Family Medicine

## 2021-06-07 ENCOUNTER — Other Ambulatory Visit: Payer: Self-pay | Admitting: Family Medicine

## 2021-06-07 DIAGNOSIS — E118 Type 2 diabetes mellitus with unspecified complications: Secondary | ICD-10-CM

## 2021-06-07 DIAGNOSIS — Z794 Long term (current) use of insulin: Secondary | ICD-10-CM

## 2021-06-09 NOTE — Telephone Encounter (Signed)
Rebecca Terrell is calling to check status of refill request Lisinopril-Hydrochorothiazide she states patient has been out for 2 days. Please advise. Thanks!

## 2021-06-14 ENCOUNTER — Other Ambulatory Visit: Payer: Self-pay | Admitting: Family Medicine

## 2021-06-28 ENCOUNTER — Other Ambulatory Visit: Payer: Self-pay

## 2021-06-28 ENCOUNTER — Ambulatory Visit (INDEPENDENT_AMBULATORY_CARE_PROVIDER_SITE_OTHER): Payer: Self-pay | Admitting: Family Medicine

## 2021-06-28 ENCOUNTER — Encounter: Payer: Self-pay | Admitting: Family Medicine

## 2021-06-28 VITALS — BP 163/79 | HR 76 | Wt 176.6 lb

## 2021-06-28 DIAGNOSIS — E1165 Type 2 diabetes mellitus with hyperglycemia: Secondary | ICD-10-CM

## 2021-06-28 DIAGNOSIS — I10 Essential (primary) hypertension: Secondary | ICD-10-CM

## 2021-06-28 LAB — POCT GLYCOSYLATED HEMOGLOBIN (HGB A1C): HbA1c, POC (controlled diabetic range): 11 % — AB (ref 0.0–7.0)

## 2021-06-28 MED ORDER — METFORMIN HCL 1000 MG PO TABS
1000.0000 mg | ORAL_TABLET | Freq: Two times a day (BID) | ORAL | 3 refills | Status: DC
Start: 2021-06-28 — End: 2022-03-28

## 2021-06-28 MED ORDER — FLUCONAZOLE 150 MG PO TABS: ORAL_TABLET | ORAL | 6 refills | Status: DC

## 2021-06-28 MED ORDER — LISINOPRIL 20 MG PO TABS
20.0000 mg | ORAL_TABLET | Freq: Every day | ORAL | 3 refills | Status: DC
Start: 2021-06-28 — End: 2021-06-28

## 2021-06-28 MED ORDER — INSULIN GLARGINE 100 UNIT/ML ~~LOC~~ SOLN: SUBCUTANEOUS | 3 refills | Status: DC

## 2021-06-28 MED ORDER — LISINOPRIL 20 MG PO TABS: ORAL_TABLET | ORAL | 3 refills | Status: DC

## 2021-06-28 NOTE — Patient Instructions (Signed)
I am increasing your long-acting insulin to 120 units a day.  I want you to be very diligent with also taking the additional short acting insulin at dinnertime.  Please increase your metformin to 1 twice a day.  I need to see you back in 4 to 6 weeks.  For your blood pressure, I am calling in a new prescription.  It is similar to the old prescription but you will take 2 of these pills daily.  When I see you back we will check some blood work.  Great to see you!

## 2021-06-29 ENCOUNTER — Encounter: Payer: Self-pay | Admitting: Family Medicine

## 2021-06-29 ENCOUNTER — Other Ambulatory Visit: Payer: Self-pay | Admitting: Family Medicine

## 2021-06-29 LAB — BASIC METABOLIC PANEL
BUN/Creatinine Ratio: 25 — ABNORMAL HIGH (ref 9–23)
BUN: 15 mg/dL (ref 6–24)
CO2: 22 mmol/L (ref 20–29)
Calcium: 9.5 mg/dL (ref 8.7–10.2)
Chloride: 105 mmol/L (ref 96–106)
Creatinine, Ser: 0.59 mg/dL (ref 0.57–1.00)
Glucose: 70 mg/dL (ref 65–99)
Potassium: 4 mmol/L (ref 3.5–5.2)
Sodium: 142 mmol/L (ref 134–144)
eGFR: 109 mL/min/{1.73_m2} (ref >59)

## 2021-06-29 NOTE — Assessment & Plan Note (Signed)
Long history of poorly controlled diabetes.  She has no insurance and this is part of what impacts her care and she does not like to run up huge bills.  She also takes her medication intermittently partially because of this.  We will increase her Lantus to 120.  Reiterated the need to do her nighttime Apidra.  Reiterated the need to go to twice daily with her metformin.  Labs today to check kidney function etc.  Follow-up 4 to 6 weeks.

## 2021-06-29 NOTE — Assessment & Plan Note (Signed)
I only have today's reading.  She says she has been compliant with her current medicine so I think we need to increase coverage.  We will switch her from Zestoretic 20/25 to lisinopril 20 mg 2 tabs per day.  She can use up the remaining let Zestoretic that she has.  We may need to add diuretic back.  I like to keep her regimen as simple as possible.  Would also like to keep it as cost efficient as possible.  Follow-up 4 to 6 weeks.

## 2021-06-29 NOTE — Progress Notes (Signed)
    CHIEF COMPLAINT / HPI: Reestablish care Here with interpreter [place]. Has been out of several of her diabetes medicines and she is not taking all of them as prescribed.  She is not checking her sugars.  She started to have some burning sensation in her hands occasionally and this is bothersome to her.  She has had no episodes of low blood sugar.  She has had no unusual weight change.   PERTINENT  PMH / PSH: I have reviewed the patient's medications, allergies, past medical and surgical history, smoking status and updated in the EMR as appropriate.   OBJECTIVE:  BP (!) 163/79   Pulse 76   Wt 176 lb 9.6 oz (80.1 kg)   LMP 03/08/2020   SpO2 99%   BMI 32.30 kg/m  Vital signs reviewed. GENERAL: Well-developed, well-nourished, no acute distress. CARDIOVASCULAR: Regular rate and rhythm  LUNGS: No unusual work of breathing MSK: Movement of extremity x 4.   ASSESSMENT / PLAN:   DM (diabetes mellitus), type 2, uncontrolled Long history of poorly controlled diabetes.  She has no insurance and this is part of what impacts her care and she does not like to run up huge bills.  She also takes her medication intermittently partially because of this.  We will increase her Lantus to 120.  Reiterated the need to do her nighttime Apidra.  Reiterated the need to go to twice daily with her metformin.  Labs today to check kidney function etc.  Follow-up 4 to 6 weeks.  HYPERTENSION, BENIGN SYSTEMIC I only have today's reading.  She says she has been compliant with her current medicine so I think we need to increase coverage.  We will switch her from Zestoretic 20/25 to lisinopril 20 mg 2 tabs per day.  She can use up the remaining let Zestoretic that she has.  We may need to add diuretic back.  I like to keep her regimen as simple as possible.  Would also like to keep it as cost efficient as possible.  Follow-up 4 to 6 weeks.   Denny Levy MD

## 2021-06-29 NOTE — Progress Notes (Signed)
  DR. Donnetta Hail PERSONAL SUMMARY OF THESE TEST RESULTS  FOR YOU:

## 2021-08-03 ENCOUNTER — Other Ambulatory Visit: Payer: Self-pay | Admitting: Family Medicine

## 2021-12-12 ENCOUNTER — Other Ambulatory Visit: Payer: Self-pay | Admitting: Family Medicine

## 2022-01-10 ENCOUNTER — Other Ambulatory Visit: Payer: Self-pay | Admitting: Family Medicine

## 2022-02-19 ENCOUNTER — Other Ambulatory Visit: Payer: Self-pay | Admitting: Family Medicine

## 2022-03-12 ENCOUNTER — Other Ambulatory Visit: Payer: Self-pay | Admitting: Family Medicine

## 2022-03-28 ENCOUNTER — Encounter: Payer: Self-pay | Admitting: Family Medicine

## 2022-03-28 ENCOUNTER — Ambulatory Visit (INDEPENDENT_AMBULATORY_CARE_PROVIDER_SITE_OTHER): Payer: Self-pay | Admitting: Family Medicine

## 2022-03-28 VITALS — BP 138/78 | HR 78 | Ht 62.0 in | Wt 175.8 lb

## 2022-03-28 DIAGNOSIS — E119 Type 2 diabetes mellitus without complications: Secondary | ICD-10-CM

## 2022-03-28 DIAGNOSIS — I10 Essential (primary) hypertension: Secondary | ICD-10-CM

## 2022-03-28 DIAGNOSIS — E1040 Type 1 diabetes mellitus with diabetic neuropathy, unspecified: Secondary | ICD-10-CM

## 2022-03-28 LAB — POCT GLYCOSYLATED HEMOGLOBIN (HGB A1C): HbA1c, POC (controlled diabetic range): 10.9 % — AB (ref 0.0–7.0)

## 2022-03-28 MED ORDER — METFORMIN HCL 1000 MG PO TABS: 1000.0000 mg | ORAL_TABLET | Freq: Two times a day (BID) | ORAL | 3 refills | Status: DC

## 2022-03-28 MED ORDER — "INSULIN SYRINGE 29G X 1/2"" 1 ML MISC": 3 refills | Status: AC

## 2022-03-28 MED ORDER — FLUCONAZOLE 150 MG PO TABS: ORAL_TABLET | ORAL | 6 refills | Status: DC

## 2022-03-28 MED ORDER — INSULIN GLARGINE 100 UNIT/ML ~~LOC~~ SOLN: SUBCUTANEOUS | 12 refills | Status: DC

## 2022-03-28 MED ORDER — LISINOPRIL 20 MG PO TABS: ORAL_TABLET | ORAL | 3 refills | Status: DC

## 2022-03-28 NOTE — Patient Instructions (Addendum)
The number [phone] is where you can call the [facility] CP program which will allow you to set up for free Pap smear.   Try some saline eye drops or OTC moisturizing eye drops for a couple of weeks. If this does not resolve in next month or so, let me know and we will send you to an eye doctor.  Let me see you in 3 months. We have adjusted your insulin. Gettiing control of your blood sugar is THE most important thing we need to do right now.

## 2022-03-29 LAB — BASIC METABOLIC PANEL
BUN/Creatinine Ratio: 30 — ABNORMAL HIGH (ref 9–23)
BUN: 19 mg/dL (ref 6–24)
CO2: 21 mmol/L (ref 20–29)
Calcium: 9.7 mg/dL (ref 8.7–10.2)
Chloride: 101 mmol/L (ref 96–106)
Creatinine, Ser: 0.64 mg/dL (ref 0.57–1.00)
Glucose: 121 mg/dL — ABNORMAL HIGH (ref 70–99)
Potassium: 3.7 mmol/L (ref 3.5–5.2)
Sodium: 137 mmol/L (ref 134–144)
eGFR: 107 mL/min/{1.73_m2} (ref >59)

## 2022-03-29 LAB — LDL CHOLESTEROL, DIRECT: LDL Direct: 89 mg/dL (ref 0–99)

## 2022-03-29 NOTE — Assessment & Plan Note (Signed)
Labs today Continue current regimen.  

## 2022-03-29 NOTE — Assessment & Plan Note (Signed)
We will increase Lantus dose to a total of 120 units at day.  She is using a split dosing schedule.  We will continue with that as it seems to work for her.  100 units in the morning and 20 units at night.  She is not using her sliding scale insulin at all.  Follow-up 3 months.  Long discussion about diabetes risks, why she is having neuropathy, importance of diet.

## 2022-03-29 NOTE — Progress Notes (Signed)
    CHIEF COMPLAINT / HPI:   Reestablish care.  Follow-up diabetes mellitus.  Having a lot of numbness and tingling in her hands and feet.  Also has some occasional lower extremity swelling.  Does not take her Lantus every day.  Sometimes she forgets in the morning.  She does take her blood pressure pill and her metformin most days.  PERTINENT  PMH / PSH: I have reviewed the patient's medications, allergies, past medical and surgical history, smoking status and updated in the EMR as appropriate.   OBJECTIVE:  BP 138/78   Pulse 78   Ht 5\' 2"  (1.575 m)   Wt 175 lb 12.8 oz (79.7 kg)   LMP 03/08/2020   SpO2 98%   BMI 32.15 kg/m   Vital signs reviewed. GENERAL: Well-developed, well-nourished, no acute distress. CARDIOVASCULAR: Regular rate and rhythm no murmur gallop or rub LUNGS: Clear to auscultation bilaterally, no rales or wheeze. ABDOMEN: Soft positive bowel sounds NEURO: Decreased sensation to soft touch bilateral plantar surface of the feet. MSK: Movement of extremity x 4. Vascular: Dorsalis pedis and posterior tibial pulses 1-2+ bilaterally symmetrical. ASSESSMENT / PLAN:   HYPERTENSION, BENIGN SYSTEMIC Labs today.  Continue current regimen.  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  She is using a split dosing schedule.  We will continue with that as it seems to work for her.  100 units in the morning and 20 units at night.  She is not using her sliding scale insulin at all.  Follow-up 3 months.  Long discussion about diabetes risks, why she is having neuropathy, importance of diet.   Denny Levy MD

## 2022-04-02 ENCOUNTER — Encounter: Payer: Self-pay | Admitting: Family Medicine

## 2022-09-05 ENCOUNTER — Ambulatory Visit (INDEPENDENT_AMBULATORY_CARE_PROVIDER_SITE_OTHER): Payer: Self-pay | Admitting: Family Medicine

## 2022-09-05 ENCOUNTER — Other Ambulatory Visit: Payer: Self-pay | Admitting: Family Medicine

## 2022-09-05 ENCOUNTER — Other Ambulatory Visit (HOSPITAL_COMMUNITY)
Admission: RE | Admit: 2022-09-05 | Discharge: 2022-09-05 | Disposition: A | Discharge status: Home / Self Care | Payer: Self-pay | Source: Ambulatory Visit | Attending: Family Medicine | Admitting: Family Medicine

## 2022-09-05 ENCOUNTER — Encounter: Payer: Self-pay | Admitting: Family Medicine

## 2022-09-05 VITALS — BP 136/68 | HR 74 | Ht 62.0 in | Wt 175.8 lb

## 2022-09-05 DIAGNOSIS — E1169 Type 2 diabetes mellitus with other specified complication: Secondary | ICD-10-CM

## 2022-09-05 DIAGNOSIS — Z124 Encounter for screening for malignant neoplasm of cervix: Secondary | ICD-10-CM

## 2022-09-05 DIAGNOSIS — I10 Essential (primary) hypertension: Secondary | ICD-10-CM

## 2022-09-05 DIAGNOSIS — Z299 Encounter for prophylactic measures, unspecified: Secondary | ICD-10-CM

## 2022-09-05 DIAGNOSIS — Z113 Encounter for screening for infections with a predominantly sexual mode of transmission: Secondary | ICD-10-CM

## 2022-09-05 DIAGNOSIS — E669 Obesity, unspecified: Secondary | ICD-10-CM

## 2022-09-05 LAB — POCT WET PREP (WET MOUNT)
Clue Cells Wet Prep Whiff POC: NEGATIVE
Trichomonas Wet Prep HPF POC: ABSENT

## 2022-09-05 LAB — POCT GLYCOSYLATED HEMOGLOBIN (HGB A1C): HbA1c, POC (controlled diabetic range): 10.7 % — AB (ref 0.0–7.0)

## 2022-09-05 MED ORDER — METFORMIN HCL 1000 MG PO TABS: 1000.0000 mg | ORAL_TABLET | Freq: Two times a day (BID) | ORAL | 3 refills | Status: DC

## 2022-09-05 MED ORDER — INSULIN GLARGINE 100 UNIT/ML ~~LOC~~ SOLN: SUBCUTANEOUS | 12 refills | Status: DC

## 2022-09-05 MED ORDER — CEPHALEXIN 500 MG PO CAPS: 500.0000 mg | ORAL_CAPSULE | Freq: Three times a day (TID) | ORAL | 0 refills | Status: AC

## 2022-09-05 MED ORDER — FLUCONAZOLE 150 MG PO TABS: ORAL_TABLET | ORAL | 6 refills | Status: DC

## 2022-09-05 MED ORDER — LISINOPRIL 20 MG PO TABS: ORAL_TABLET | ORAL | 3 refills | Status: DC

## 2022-09-05 NOTE — Assessment & Plan Note (Signed)
Good control.  Will continue current medications.

## 2022-09-05 NOTE — Assessment & Plan Note (Signed)
Refills done.  I do not know what different to do for her diabetes mellitus.  We discussed.

## 2022-09-05 NOTE — Assessment & Plan Note (Signed)
Pap smear and STI checking today

## 2022-09-05 NOTE — Patient Instructions (Signed)
I am Rx an antibiotic for your skin abscess. Take ALL of the pills even if yeh abscess clears before you are done with the rx. If the skin abscess does NOTR totally clear, I need to know ASAP.  I am also sending in some medicine for a yeast infection in case you need that.  I will send you a note about your pap smear. Your A1C is still quite high as we discussed.

## 2022-09-05 NOTE — Progress Notes (Signed)
    CHIEF COMPLAINT / HPI: #1.  Diabetes mellitus follow-up: Needs some refills.  Still not checking her sugars.  Mostly taking her medicines regularly.  No episodes of low blood sugar.  No unusual urinary frequency. 2.  He has a small abscess on her face has been there about 2 weeks.  It got a lot worse after about the first week and now starting to improve but it continues to drain pus.  No fever.  Says it started as a pimple. 3.  Needs her Pap smear.   PERTINENT  PMH / PSH: I have reviewed the patient's medications, allergies, past medical and surgical history, smoking status and updated in the EMR as appropriate.   OBJECTIVE:  BP 136/68   Pulse 74   Ht 5\' 2"  (1.575 m)   Wt 175 lb 12.8 oz (79.7 kg)   LMP 03/08/2020   SpO2 98%   BMI 32.15 kg/m  GENERAL: Well-developed female no acute distress Skin: 8 x 9 mm raised pustule on the right cheek.  Some induration and some small amount of pus is draining.  Deep palpation of the cheek reveals this is mostly a superficial lesion.  There is no tract.  No mass noted from exam of the inner cheek.  No gum abscess. GU: Externally normal.  Cervix is normal.  There is a small polyp at the 12:00 region of the cervical os.  No adnexal masses or tenderness although this exam is limited somewhat by habitus.  Pap smear obtained.  ASSESSMENT / PLAN: Skin abscess on face: Fortunately this is still very superficial.  Will treat with Keflex.  Follow-up in few weeks.  She does not totally resolve in 7 to 10 days or if it gets worse, need to see her back sooner.  We have discussed.  Preventive measure Pap smear and STI checking today  Diabetes mellitus type 2 in obese (HCC) Refills done.  I do not know what different to do for her diabetes mellitus.  We discussed.  HYPERTENSION, BENIGN SYSTEMIC Good control.  Will continue current medications.   05/08/2020 MD

## 2022-09-07 LAB — CYTOLOGY - PAP
Chlamydia: NEGATIVE
Comment: NEGATIVE
Comment: NEGATIVE
Comment: NORMAL
Diagnosis: NEGATIVE
High risk HPV: NEGATIVE
Neisseria Gonorrhea: NEGATIVE

## 2022-09-10 ENCOUNTER — Encounter: Payer: Self-pay | Admitting: Family Medicine

## 2022-09-26 ENCOUNTER — Ambulatory Visit: Payer: Self-pay | Admitting: Family Medicine

## 2022-10-31 ENCOUNTER — Ambulatory Visit: Payer: Self-pay | Admitting: Family Medicine

## 2023-01-08 IMAGING — US US AXILLARY RIGHT
1 series · 6 of 6 positions shown · non-contrast
Comparison: Previous exam(s).
COMPARISON: Previous exam(s).

Addendum:
CLINICAL DATA: 49-year-old female with a right axillary lump, first
felt 4 days ago with marked increase in size, tenderness and redness
since then.

EXAM:
DIGITAL DIAGNOSTIC BILATERAL MAMMOGRAM WITH TOMOSYNTHESIS AND CAD;
US AXILLARY RIGHT
TECHNIQUE: Bilateral digital diagnostic mammography and breast tomosynthesis
was performed. The images were evaluated with computer-aided
detection.; Targeted ultrasound examination of the right axilla was
performed.

[Series 1: us axillary right · 0.06mm/px · 6 of 6 slices shown]
[im 1/6]
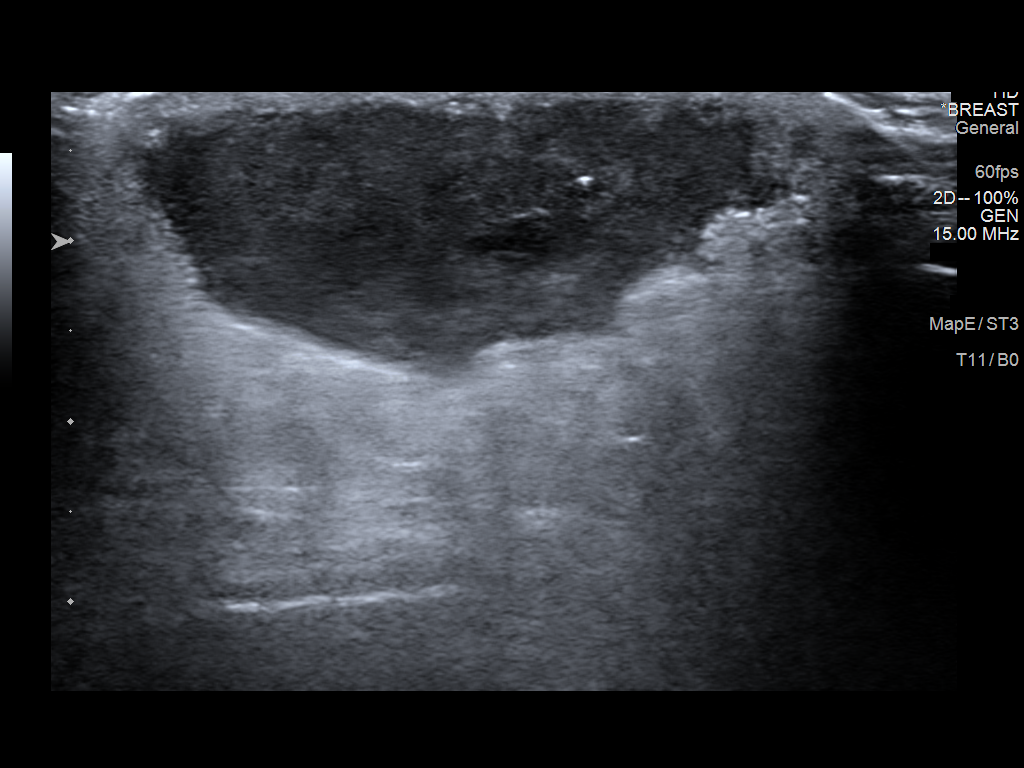
[im 2/6]
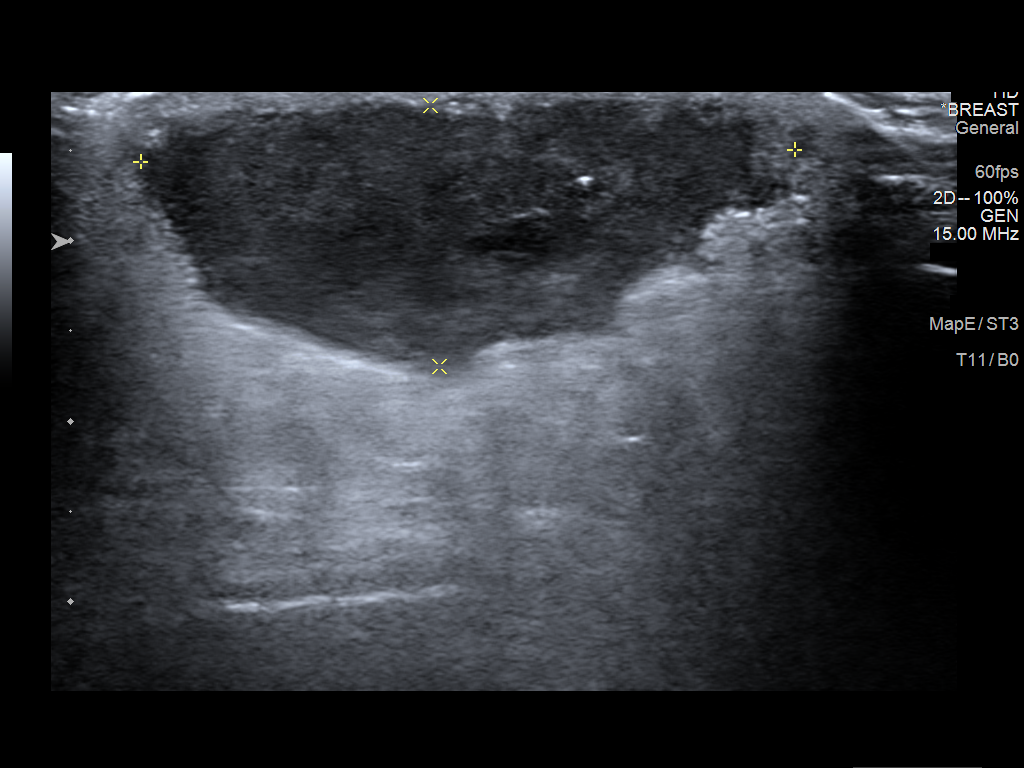
[im 3/6]
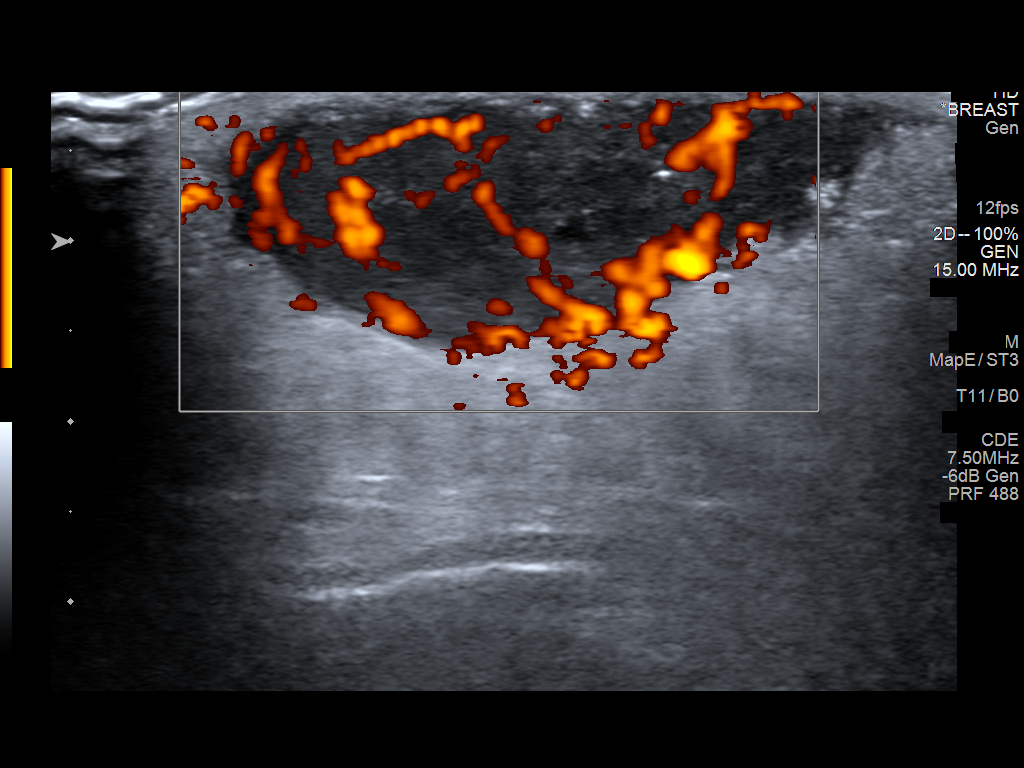
[im 4/6]
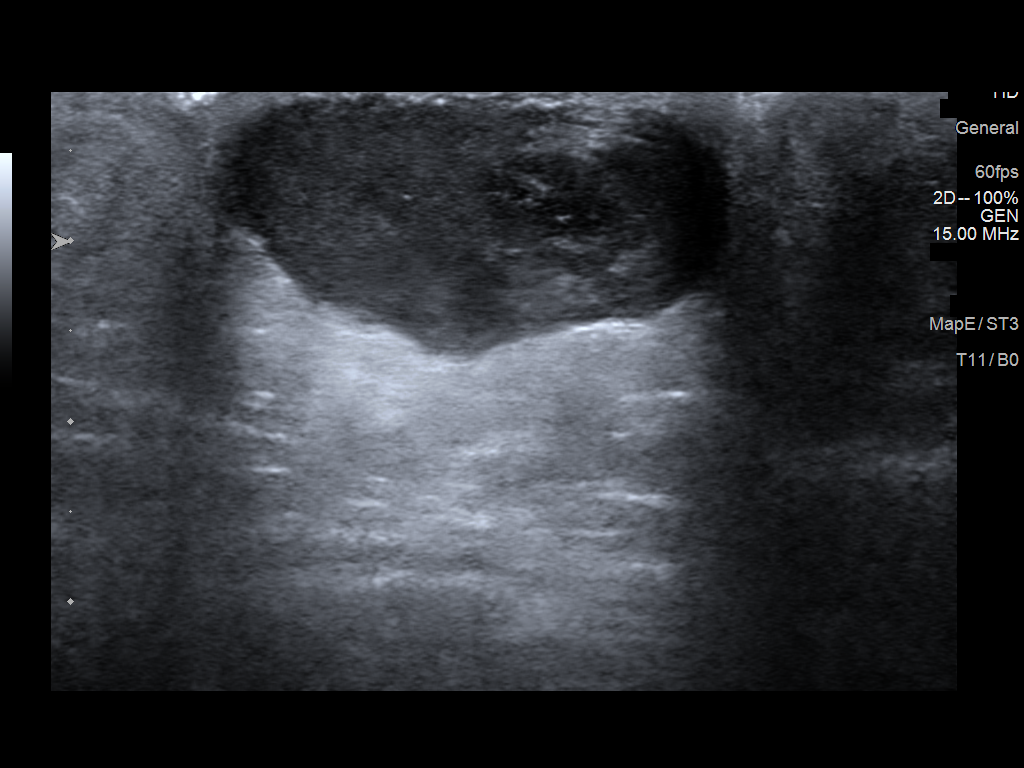
[im 5/6]
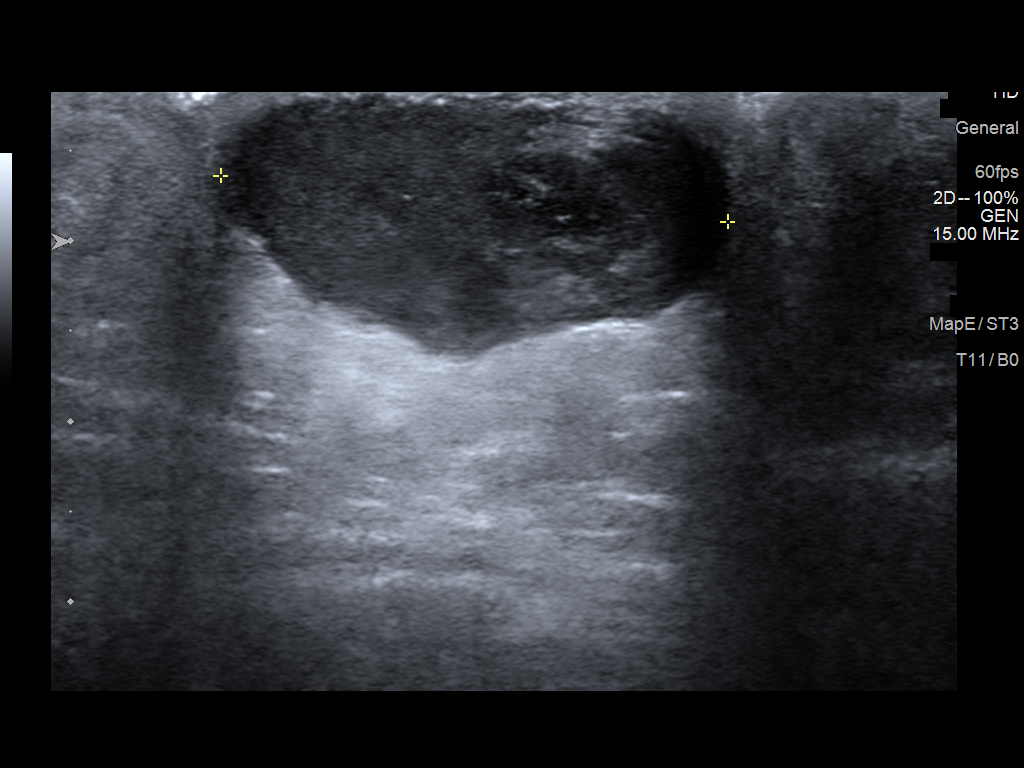
[im 6/6]
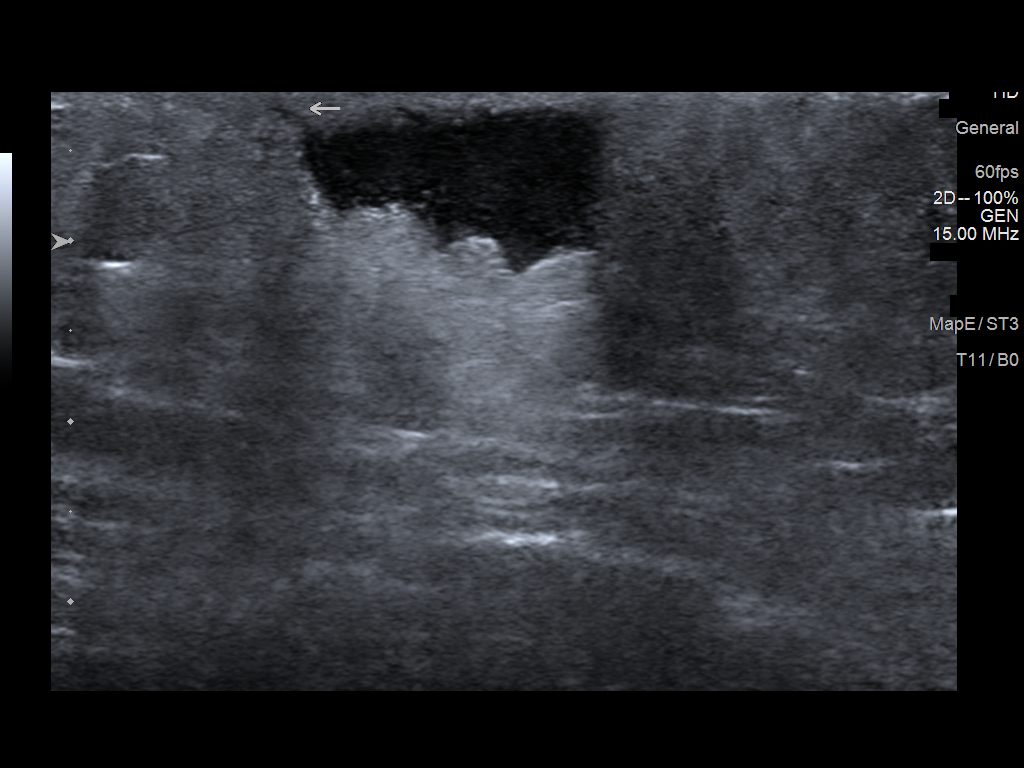

[6 of 6 positions shown; findings below may reference images not displayed]

ACR Breast Density Category b: There are scattered areas of
fibroglandular density.
FINDINGS: There are no suspicious mammographic findings in either breast. The
parenchymal pattern is stable. An oval circumscribed mass is seen
superficially within the right axilla on spot views.

On physical exam, there is a 2-3 cm protruding lump with overlying
redness and tenderness in the right axilla.

Targeted ultrasound is performed, showing an irregular hypoechoic
mass with marked associated vascularity within the right axilla
corresponding with the patient's palpable lump and pain. Note is
made of an internal cystic area without associated vascularity.
Questioned small tract to the overlying skin surface.
IMPRESSION: 1. Findings suggestive of a ruptured inclusion cyst within the right
axilla causing the patient's sudden increase in symptoms.
Recommendation is for the patient to begin a course of antibiotics
with surgical follow-up next week.
2. Otherwise no mammographic evidence of malignancy in either
breast.

RECOMMENDATION:
1. The patient was prescribed a course of doxycycline and a referral
will be made for surgical evaluation next week.
2. Otherwise, Screening mammogram in one year.(Code:04-U-4X7)

I have discussed the findings and recommendations with the patient.
If applicable, a reminder letter will be sent to the patient
regarding the next appointment.

BI-RADS CATEGORY  2: Benign.

ADDENDUM:
Surgical consultation has been arranged with Dr. Kevin Gary Epsiuq at
[REDACTED] on January 10, 2021.

Thomdary Rastall RN on 01/06/2021

*** End of Addendum ***
ACR Breast Density Category b: There are scattered areas of
fibroglandular density.
FINDINGS: There are no suspicious mammographic findings in either breast. The
parenchymal pattern is stable. An oval circumscribed mass is seen
superficially within the right axilla on spot views.

On physical exam, there is a 2-3 cm protruding lump with overlying
redness and tenderness in the right axilla.

Targeted ultrasound is performed, showing an irregular hypoechoic
mass with marked associated vascularity within the right axilla
corresponding with the patient's palpable lump and pain. Note is
made of an internal cystic area without associated vascularity.
Questioned small tract to the overlying skin surface.
IMPRESSION: 1. Findings suggestive of a ruptured inclusion cyst within the right
axilla causing the patient's sudden increase in symptoms.
Recommendation is for the patient to begin a course of antibiotics
with surgical follow-up next week.
2. Otherwise no mammographic evidence of malignancy in either
breast.

RECOMMENDATION:
1. The patient was prescribed a course of doxycycline and a referral
will be made for surgical evaluation next week.
2. Otherwise, Screening mammogram in one year.(Code:04-U-4X7)

I have discussed the findings and recommendations with the patient.
If applicable, a reminder letter will be sent to the patient
regarding the next appointment.

BI-RADS CATEGORY  2: Benign.

## 2023-01-28 ENCOUNTER — Other Ambulatory Visit: Payer: Self-pay | Admitting: Family Medicine

## 2023-03-08 ENCOUNTER — Encounter: Payer: Self-pay | Admitting: Family Medicine

## 2023-03-08 ENCOUNTER — Ambulatory Visit (INDEPENDENT_AMBULATORY_CARE_PROVIDER_SITE_OTHER): Payer: Self-pay | Admitting: Family Medicine

## 2023-03-08 VITALS — BP 182/82 | HR 80 | Temp 99.0°F | Wt 178.4 lb

## 2023-03-08 DIAGNOSIS — R051 Acute cough: Secondary | ICD-10-CM

## 2023-03-08 DIAGNOSIS — Z1231 Encounter for screening mammogram for malignant neoplasm of breast: Secondary | ICD-10-CM

## 2023-03-08 DIAGNOSIS — L0292 Furuncle, unspecified: Secondary | ICD-10-CM

## 2023-03-08 MED ORDER — DOXYCYCLINE HYCLATE 100 MG PO TABS
100.0000 mg | ORAL_TABLET | Freq: Two times a day (BID) | ORAL | 0 refills | Status: AC
Start: 2023-03-08 — End: 2023-03-15

## 2023-03-08 NOTE — Progress Notes (Unsigned)
    SUBJECTIVE:   CHIEF COMPLAINT / HPI:   Patient presents with boil under her right arm. First noticed it 3-4 days ago and since it has first developed it has grown. Endorses some pain but mainly irritation with certain arm positions. Denies any drainage or bleeding noted. Has not touched it or tried anything for it other than applying Aveeno cream. Denies fever, chills, unintentional weight loss, night sweats or personal history of cancer. She had a boil along a similar place on the right side closer to her breast and had this drained by an oncologist about 1-2 years ago.   Recently recovered from viral illness, symptoms started 1-2 weeks ago. Now she has recovered but seems to have a lingering cough. Denies any other symptoms as the rest of them have resolved.   OBJECTIVE:   BP 170/77   Pulse 80   Temp 99 F (37.2 C)   Wt 178 lb 6.4 oz (80.9 kg)   LMP 03/08/2020   SpO2 100%   BMI 32.63 kg/m   General: Patient well-appearing, in no acute distress. CV: RRR, no murmurs or gallops auscultated Resp: CTAB, no wheezing, rales or rhonchi noted MSK: fluctuant circumscribed midlly ertyematous area along right axillary region measuring about 3-4 cm in diameter, tender to palpation, no presence of axillary or clavicular lymphadenopathy   ***Procedure   ASSESSMENT/PLAN:   No problem-specific Assessment & Plan notes found for this encounter.  Mammogram*** In-person Spanish interpretation (Delia Velazquez) utilized throughout the entirety of this encounter.  I&D procedure note *** consent  Return precautions  cough  Graceann Boileau Robyne Peers, DO [facility]

## 2023-03-08 NOTE — Patient Instructions (Addendum)
  Fue genial verte hoy!  Hoy hablamos de tu fornculo, le drenamos mucho lquido. Esto debera ayudar a la curacin y Chief Technology Officer.   Me recetaron un antibitico llamado doxiciclina 100 mg, tmelo dos veces al da durante 7 [place].   He ordenado Hospital doctor, llame para programar un horario que sea conveniente para usted.  Si nota enrojecimiento, hinchazn, drenaje que parece pus o fiebre, regrese.   Haga un seguimiento en su prxima cita programada; si surge algo entre ahora y [place], no dude en comunicarse con nuestra oficina.   Gracias por permitirnos ser parte de su atencin mdica!  Gracias, Dr. Robyne Peers  It was great seeing you today!  Today we discussed your boil, we drained a lot of fluid out of it. This should help the healing and pain.   I have prescribed an antibiotic called doxycycline 100 mg, please take this twice a day for 7 days.   I have ordered your mammogram, please call to schedule a time that is convenient for you.  If you notice any redness, swelling, drainage that looks like pus or a fever then please return.   Please follow up at your next scheduled appointment, if anything arises between now and then, please don't hesitate to contact our office.   Thank you for allowing [place] to be a part of your medical care!  Thank you, Dr. Robyne Peers

## 2023-03-09 DIAGNOSIS — R059 Cough, unspecified: Secondary | ICD-10-CM | POA: Insufficient documentation

## 2023-03-09 DIAGNOSIS — L0292 Furuncle, unspecified: Secondary | ICD-10-CM | POA: Insufficient documentation

## 2023-03-09 NOTE — Assessment & Plan Note (Signed)
-  cough likely secondary to viral illness which patient has recovered from but with lingering cough -reassurance provided and discussed that it can take a few weeks for the cough to completely resolve -supportive care measures discussed, advised honey

## 2023-03-09 NOTE — Assessment & Plan Note (Addendum)
-  axillary fluctuant furuncle or cyst along the right side, patient would benefit from I&D to decrease pain and relieve some of the pressure. Discussed procedure including indications, risks, benefits. Patient agreed and consent obtained. -I&D performed, patient tolerated procedure well without complications -doxycycline 100 mg bid, 7 day course prescribed  -strict return precautions discussed

## 2023-03-14 ENCOUNTER — Other Ambulatory Visit: Payer: Self-pay | Admitting: Family Medicine

## 2023-04-12 ENCOUNTER — Other Ambulatory Visit: Payer: Self-pay | Admitting: Family Medicine

## 2023-05-21 ENCOUNTER — Other Ambulatory Visit: Payer: Self-pay | Admitting: Family Medicine

## 2023-12-05 ENCOUNTER — Other Ambulatory Visit: Payer: Self-pay | Admitting: Family Medicine

## 2023-12-25 ENCOUNTER — Telehealth: Payer: Self-pay

## 2023-12-25 NOTE — Telephone Encounter (Signed)
 Patient's friend, Wendi Maya, calls nurse line regarding concerns with "boil between breasts."   She states that this is the "same thing that she always gets and need an antibiotic to treat."   Denies drainage, fever or chills.   Patient previously had to have an I&D on 03/08/2023.  Advised that I would recommend being seen in the office for further evaluation. Delia is asking that I forward message to Dr. Jennette Kettle before scheduling with another provider.   Delia states that she will also inform friend of recommendation to be seen in person for concern.   Patient has follow up with PCP on 01/22/24, however, advised that I would want her to be evaluated sooner for this concern.   UC/ED precautions given.   Veronda Prude, RN

## 2023-12-26 NOTE — Telephone Encounter (Signed)
 Patient scheduled by front office staff for 12/30/23.  Veronda Prude, RN

## 2023-12-30 ENCOUNTER — Ambulatory Visit (INDEPENDENT_AMBULATORY_CARE_PROVIDER_SITE_OTHER): Payer: Self-pay | Admitting: Student

## 2023-12-30 VITALS — BP 169/77 | HR 77 | Ht 62.0 in | Wt 184.6 lb

## 2023-12-30 DIAGNOSIS — E669 Obesity, unspecified: Secondary | ICD-10-CM

## 2023-12-30 DIAGNOSIS — I10 Essential (primary) hypertension: Secondary | ICD-10-CM

## 2023-12-30 DIAGNOSIS — L0390 Cellulitis, unspecified: Secondary | ICD-10-CM | POA: Insufficient documentation

## 2023-12-30 DIAGNOSIS — E1169 Type 2 diabetes mellitus with other specified complication: Secondary | ICD-10-CM

## 2023-12-30 LAB — POCT GLYCOSYLATED HEMOGLOBIN (HGB A1C): HbA1c, POC (controlled diabetic range): 11.9 % — AB (ref 0.0–7.0)

## 2023-12-30 MED ORDER — DOXYCYCLINE HYCLATE 100 MG PO CAPS: 100.0000 mg | ORAL_CAPSULE | Freq: Two times a day (BID) | ORAL | 0 refills | Status: AC

## 2023-12-30 MED ORDER — DOXYCYCLINE HYCLATE 100 MG PO CAPS: 100.0000 mg | ORAL_CAPSULE | Freq: Two times a day (BID) | ORAL | 0 refills | Status: DC

## 2023-12-30 NOTE — Progress Notes (Signed)
    SUBJECTIVE:   CHIEF COMPLAINT / HPI:   Pt notes "boil" on chest present for past week which is painful. No draining.  No fevers or chills. Has had a similar lesion in the R axilla and chest which responded well to antibiotics.   PERTINENT  PMH / PSH: HTN, T2DM  OBJECTIVE:   BP (!) 169/77   Pulse 77   Ht 5\' 2"  (1.575 m)   Wt 184 lb 9.6 oz (83.7 kg)   LMP 03/08/2020   SpO2 98%   BMI 33.76 kg/m    General: NAD, pleasant Cardiac: RRR, no murmurs. Respiratory: CTAB, normal effort, No wheezes, rales or rhonchi Skin: Indurated lesion ~3 cm in diameter near medial portion of left breast with surrounding erythema and dimpling of the skin, warm to touch.  See photo. Neuro: alert, no obvious focal deficits Psych: Normal affect and mood   Procedure I&D Risks and benefits discussed Consent form signed Area cleansed with Betadine swabs and alcohol swabs 0.5 cc lidocaine with epi administered Small incision made with scalpel No purulent fluid expressed Less than 1 cc blood loss Band-Aid with triple antibiotic ointment applied  ASSESSMENT/PLAN:   Type 2 diabetes mellitus with obesity (HCC) Uncontrolled with A1c 11.9.  Has appointment with PCP 01/22/24  HYPERTENSION, BENIGN SYSTEMIC Uncontrolled and asymptomatic.   States she did take her lisinopril 20 mg last night as usual. Has apt with PCP for recheck on 01/22/24  Cellulitis cellulitis of chest between breasts.  Attempted I&D but no drainage.  Area is indurated. No systemic symptoms. -Rx Doxycycline -ED/return precautions discussed      Dr. Erick Alley, DO [facility]

## 2023-12-30 NOTE — Assessment & Plan Note (Addendum)
 cellulitis of chest between breasts.  Attempted I&D but no drainage.  Area is indurated. No systemic symptoms. -Rx Doxycycline -ED/return precautions discussed

## 2023-12-30 NOTE — Assessment & Plan Note (Signed)
 Uncontrolled with A1c 11.9.  Has appointment with PCP 01/22/24

## 2023-12-30 NOTE — Assessment & Plan Note (Signed)
 Uncontrolled and asymptomatic.   States she did take her lisinopril 20 mg last night as usual. Has apt with PCP for recheck on 01/22/24

## 2023-12-30 NOTE — Patient Instructions (Addendum)
 It was great to see you! Thank you for allowing me to participate in your care!  I recommend that you always bring your medications to each appointment as this makes it easy to ensure you are on the correct medications and helps [place] not miss when refills are needed.  Our plans for today:  -Take the full course of doxycycline (an antibiotic) twice daily for 7 days -If you develop any fever or chills, please return or go to the ED -Return for your follow-up appointment with Dr. Jennette Kettle in April   Take care and seek immediate care sooner if you develop any concerns.   Dr. Erick Alley, DO [facility]

## 2024-01-16 ENCOUNTER — Other Ambulatory Visit: Payer: Self-pay | Admitting: Family Medicine

## 2024-01-22 ENCOUNTER — Ambulatory Visit (INDEPENDENT_AMBULATORY_CARE_PROVIDER_SITE_OTHER): Payer: Self-pay | Admitting: Family Medicine

## 2024-01-22 VITALS — BP 137/87 | HR 81 | Wt 184.0 lb

## 2024-01-22 DIAGNOSIS — I10 Essential (primary) hypertension: Secondary | ICD-10-CM

## 2024-01-22 DIAGNOSIS — L02213 Cutaneous abscess of chest wall: Secondary | ICD-10-CM

## 2024-01-22 DIAGNOSIS — E1169 Type 2 diabetes mellitus with other specified complication: Secondary | ICD-10-CM

## 2024-01-22 DIAGNOSIS — B3731 Acute candidiasis of vulva and vagina: Secondary | ICD-10-CM

## 2024-01-22 DIAGNOSIS — E669 Obesity, unspecified: Secondary | ICD-10-CM

## 2024-01-22 MED ORDER — LISINOPRIL 20 MG PO TABS: ORAL_TABLET | ORAL | 3 refills | Status: DC

## 2024-01-22 MED ORDER — METFORMIN HCL 1000 MG PO TABS: 1000.0000 mg | ORAL_TABLET | Freq: Two times a day (BID) | ORAL | 3 refills | Status: DC

## 2024-01-22 MED ORDER — FLUCONAZOLE 150 MG PO TABS: ORAL_TABLET | ORAL | 11 refills | Status: AC

## 2024-01-22 MED ORDER — INSULIN GLARGINE 100 UNIT/ML ~~LOC~~ SOLN: SUBCUTANEOUS | 6 refills | Status: DC

## 2024-01-22 NOTE — Patient Instructions (Signed)
 We need to get your blood sugar down, so decrease your bread and tortillas--try  to decrease to only one serving of bread a day. Let  me see you in 3 months!

## 2024-01-23 ENCOUNTER — Telehealth: Payer: Self-pay

## 2024-01-23 LAB — LDL CHOLESTEROL, DIRECT: LDL Direct: 122 mg/dL — ABNORMAL HIGH (ref 0–99)

## 2024-01-23 LAB — COMPREHENSIVE METABOLIC PANEL WITH GFR
ALT: 24 IU/L (ref 0–32)
AST: 16 IU/L (ref 0–40)
Albumin: 4.1 g/dL (ref 3.8–4.9)
Alkaline Phosphatase: 142 IU/L — ABNORMAL HIGH (ref 44–121)
BUN/Creatinine Ratio: 22 (ref 9–23)
BUN: 15 mg/dL (ref 6–24)
Bilirubin Total: 0.2 mg/dL (ref 0.0–1.2)
CO2: 22 mmol/L (ref 20–29)
Calcium: 9.9 mg/dL (ref 8.7–10.2)
Chloride: 100 mmol/L (ref 96–106)
Creatinine, Ser: 0.69 mg/dL (ref 0.57–1.00)
Globulin, Total: 3.3 g/dL (ref 1.5–4.5)
Glucose: 396 mg/dL — ABNORMAL HIGH (ref 70–99)
Potassium: 4.6 mmol/L (ref 3.5–5.2)
Sodium: 137 mmol/L (ref 134–144)
Total Protein: 7.4 g/dL (ref 6.0–8.5)
eGFR: 104 mL/min/{1.73_m2} (ref >59)

## 2024-01-23 NOTE — Telephone Encounter (Signed)
 Patient's friend, Delia, calls nurse line regarding lisinopril prescription.   Patient is requesting that rx be sent for 40 mg tablet, as two, 20 mg tablets is more expensive.   Will forward to PCP.   Elsie Halo, RN

## 2024-01-24 NOTE — Progress Notes (Signed)
    CHIEF COMPLAINT / HPI: Follow-up diabetes mellitus: Not taking her short acting insulin .  Not taking 2 metformin  a day, she is only taking 1.  Does not check sugars. 2.  Continues to have some numbness and tingling in her feet and toes started to get some of that in her hands. #3.  Follow-up abscess on her chest.  She has completed the antibiotics and it is much better.   PERTINENT  PMH / PSH: I have reviewed the patient's medications, allergies, past medical and surgical history, smoking status and updated in the EMR as appropriate.   OBJECTIVE:  BP 137/87   Pulse 81   Wt 184 lb (83.5 kg)   LMP 03/08/2020   SpO2 98%   BMI 33.65 kg/m  Vital signs reviewed. GENERAL: Well-developed, well-nourished, no acute distress. CARDIOVASCULAR: Regular rate and rhythm no murmur gallop or rub LUNGS: Clear to auscultation bilaterally, no rales or wheeze. ABDOMEN: Soft positive bowel sounds NEURO: No gross focal neurological deficits. MSK: Movement of extremity x 4. SKIN: Area on the chest between her breasts which previously had some induration and erythema with small focal pustule is now totally resolved.  There is a small thickened area of the skin left over from the induration but there is no sign of infection, no fluctuance.  ASSESSMENT / PLAN: #1.  Diabetes mellitus: Extremely poorly controlled.  Discussed at length reviewing diet ,exercise and medications.  Will increase her long-acting insulin  to 140 in the morning and continue 20 units at night..  Will have her increase her metformin  to twice a day if she can.  Will see her back in 3 months, hopefully will see some improvement in A1c at that time. #2.  Refilled Diflucan  for chronic vaginal yeast infection, intertrigo.  This is worked well for her. 3.  Skin abscess resolved on her chest wall.  Does not need any more antibiotic therapy. No problem-specific Assessment & Plan notes found for this encounter.   Violetta Grice MD

## 2024-01-27 MED ORDER — LISINOPRIL 40 MG PO TABS: 40.0000 mg | ORAL_TABLET | Freq: Every day | ORAL | 3 refills | Status: DC

## 2024-01-28 ENCOUNTER — Encounter: Payer: Self-pay | Admitting: Family Medicine

## 2024-02-04 ENCOUNTER — Other Ambulatory Visit: Payer: Self-pay | Admitting: Family Medicine

## 2024-02-12 ENCOUNTER — Telehealth: Payer: Self-pay

## 2024-02-12 MED ORDER — METFORMIN HCL 1000 MG PO TABS: 1000.0000 mg | ORAL_TABLET | Freq: Two times a day (BID) | ORAL | 3 refills | Status: DC

## 2024-02-12 MED ORDER — LISINOPRIL 40 MG PO TABS: 40.0000 mg | ORAL_TABLET | Freq: Every day | ORAL | 3 refills | Status: AC

## 2024-02-12 NOTE — Telephone Encounter (Signed)
 Received call from Delia, regarding patient's prescriptions.   Patient is enrolled with medication assistance at [facility]. They are requesting that metformin  and lisinopril  prescriptions be transferred from [facility].   Called and cancelled at [facility]. Resent refills to [facility] pharmacy.   Elsie Halo, RN

## 2024-02-17 ENCOUNTER — Other Ambulatory Visit: Payer: Self-pay

## 2024-02-17 NOTE — Telephone Encounter (Signed)
 [facility] faxed about medication Insulin  Glargine (Lantus ) 100 unit/ml injection. Stated that [employer] has had a lantus  vial supply issue since February and are currently awaiting a pending shipment. Would like to know if can have temporary prescription for Lantus  Solostar pens?  Placed in PCP's box.  Christ Courier, CMA

## 2024-02-18 ENCOUNTER — Other Ambulatory Visit: Payer: Self-pay | Admitting: Family Medicine

## 2024-02-18 MED ORDER — INSULIN GLARGINE 100 UNIT/ML SOLOSTAR PEN: PEN_INJECTOR | SUBCUTANEOUS | 11 refills | Status: DC

## 2024-02-18 NOTE — Progress Notes (Signed)
 [facility]acy [facility] out of insulin  vials. Sent in pens for 2 months per request  Otho Blitz, MD  [facility] Teaching Service

## 2024-02-19 ENCOUNTER — Telehealth: Payer: Self-pay

## 2024-02-19 MED ORDER — INSULIN GLARGINE 100 UNIT/ML SOLOSTAR PEN: PEN_INJECTOR | SUBCUTANEOUS | 11 refills | Status: DC

## 2024-02-19 NOTE — Telephone Encounter (Signed)
 Rx written for solostar pens and sent

## 2024-02-19 NOTE — Telephone Encounter (Signed)
 Received VM from [facility] pharmacy regarding clarification on Lantus  prescription.   They received rx from [facility] that indicated for patient to administer 140 units in the AM and 20 units at night, then another rx from Dr. Andree Kayser to administer 120 units in the morning and 40 in the evening.   They are asking which set of directions patient should be following.   Will forward to PCP for further advisement.   Elsie Halo, RN

## 2024-02-20 NOTE — Telephone Encounter (Signed)
 I looked back through the chart and I believe it is supposed to be 120 units in AM and 40 units at night, because it was an increase from previous dose  (per my last note) THANKS! Violetta Grice

## 2024-02-21 NOTE — Telephone Encounter (Signed)
 Called [facility] pharmacy and spoke with [facility]. Provided with verbal instructions per note from Dr. Andree Kayser.   Elsie Halo, RN

## 2024-04-17 ENCOUNTER — Ambulatory Visit (INDEPENDENT_AMBULATORY_CARE_PROVIDER_SITE_OTHER): Payer: Self-pay | Admitting: Family Medicine

## 2024-04-17 VITALS — BP 140/88 | HR 87 | Temp 99.2°F | Ht 62.0 in | Wt 184.6 lb

## 2024-04-17 DIAGNOSIS — E669 Obesity, unspecified: Secondary | ICD-10-CM

## 2024-04-17 DIAGNOSIS — E1169 Type 2 diabetes mellitus with other specified complication: Secondary | ICD-10-CM

## 2024-04-17 DIAGNOSIS — H9202 Otalgia, left ear: Secondary | ICD-10-CM

## 2024-04-17 LAB — POCT GLYCOSYLATED HEMOGLOBIN (HGB A1C): HbA1c, POC (controlled diabetic range): 10.8 % — AB (ref 0.0–7.0)

## 2024-04-17 MED ORDER — OFLOXACIN 0.3 % OT SOLN: 10.0000 [drp] | Freq: Every day | OTIC | 0 refills | Status: AC

## 2024-04-17 NOTE — Assessment & Plan Note (Signed)
 A1c 10.8 today.  Per chart review, patient's reported regimen not consistent with most recent PCP visit. -Recommended diabetes regimen per most recent PCP visit 01/22/24:   - 140 LAI in the morning, 20 LAI at night  - Metformin  1000 twice a day -Scheduled patient for PCP follow-up 05/27/24

## 2024-04-17 NOTE — Patient Instructions (Addendum)
 Gracias por visitar la clnica hoy y permitirnos participar en su atencin.  Lo ms probable es que tenga una infeccin del odo externo en el odo izquierdo. Use las gotas ticas medicinales (Floxin ) para aliviar esto. Aplique 10 gotas en el odo izquierdo todos los [address]  [address] durante la prxima semana. Djelas actuar de 5 a 10 minutos cada vez. No se introduzca nada en el odo ni use ningn otro producto alrededor del odo en este momento.  Su nivel de A1c segua alto hoy. Tome la siguiente medicacin, segn lo recomendado previamente por su mdico de cabecera, el Dr. Rosalynn: - 140 unidades de insulina por la maana - 20 unidades de insulina por la noche - 1000 dosis de [employer] al da  Por favor, controle su nivel de azcar en sangre en casa y traiga un registro a su prxima cita. Su cita con su mdico de cabecera, el Dr. Rosalynn, es el 20/8/25 a las 10:00.   Comunquese en cualquier momento si tiene alguna pregunta o inquietud. Estamos aqu para usted!  [facility] Damien Cassis [facility] [phone]  _____  Thank you for visiting clinic today and allowing us  to participate in your care!  You most likely have an outer ear infection of your left ear. Please use the medicinal ear drops (Floxin ) to help with this. Place 10 drops into your left ear every day for the next week. Leave the ear drops in for 5-10 minutes at a time. Do not stick anything into your ear and do not use any other products around your ear at this time.   Your A1c was still high today. Please take the following medication regimen as your PCP Dr Rosalynn previously recommended:  - 140 units insulin  in the morning - 20 units insulin  in the evening - Metformin  1000 twice a day   Please keep track of your blood sugar at home and bring a log of them with you to your next appointment. You are scheduled to see your PCP Dr Rosalynn on 05/27/24 at 10 AM.   Reach out any time with any questions or concerns you may  have - we are here for you!  Damien Cassis, MD [facility] [phone]

## 2024-04-17 NOTE — Progress Notes (Signed)
    SUBJECTIVE:   CHIEF COMPLAINT / HPI:   Ear pain - left ear pain started 3 days ago 3 days - some clear drainage, no bleeding, feel stuffed up - decreased hearing  - No foreign objects placed in ear - Has felt feverish - no recorded temperature at home - Some stuffy nose and cough  - No known sick contacts  - No N/V/D  - No dizziness  - Tolerating p.o. well  T2DM -Requests checking an A1c today -Reported current home regimen includes: 120 LAI in morning and metformin  1000 once a day.  PERTINENT  PMH / PSH: T2DM, HTN  OBJECTIVE:   BP (!) 140/88   Pulse 87   Temp 99.2 F (37.3 C)   Ht 5' 2 (1.575 m)   Wt 184 lb 9.6 oz (83.7 kg)   LMP 03/08/2020   SpO2 98%   BMI 33.76 kg/m   General: Well-appearing. Resting comfortably in room. HEENT: MMM. Non-erythematous, non-bulging TM bilaterally. Erythematous external L ear canal with clear drainage.  Unremarkable right ear canal.  Normal appearing oropharynx without tonsillar enlargement or exudate. No cervical lymphadenopathy.  CV: Normal S1/S2. No extra heart sounds. Warm and well-perfused. Pulm: Breathing comfortably on room air. CTAB. No increased WOB. Abd: Soft, non-tender, non-distended. Skin:  Warm, dry. Psych: Pleasant and appropriate.    ASSESSMENT/PLAN:   Assessment & Plan Left ear pain Suspect otitis externa. -Discussed Floxin  x 7 days -Discussed avoiding other products or any objects in the ear Type 2 diabetes mellitus with obesity (HCC) A1c 10.8 today.  Per chart review, patient's reported regimen not consistent with most recent PCP visit. -Recommended diabetes regimen per most recent PCP visit 01/22/24:   - 140 LAI in the morning, 20 LAI at night  - Metformin  1000 twice a day -Scheduled patient for PCP follow-up 05/27/24   RTC for PCP follow-up as above  Damien Cassis, MD [facility]

## 2024-05-27 ENCOUNTER — Ambulatory Visit: Payer: Self-pay | Admitting: Family Medicine

## 2024-09-11 ENCOUNTER — Telehealth: Payer: Self-pay

## 2024-09-11 NOTE — Telephone Encounter (Signed)
 Received call from [facility] pharmacy regarding patient's metformin  1000 mg prescription.   Pharmacist reports that patient states that she has only been taking metformin  1,000 mg one tablet daily, whereas rx is for BID dosing. Patient told pharmacist that she did not want to be taking it BID.   Pharmacist is requesting that we send over new prescription for once daily dosing if this is fine with Dr. Rosalynn.   Please advise.   Chiquita JAYSON English, RN

## 2024-09-14 MED ORDER — METFORMIN HCL 1000 MG PO TABS: 1000.0000 mg | ORAL_TABLET | Freq: Every day | ORAL | 3 refills | Status: AC

## 2024-09-14 NOTE — Telephone Encounter (Signed)
Done and rx sent

## 2024-10-07 ENCOUNTER — Other Ambulatory Visit: Payer: Self-pay | Admitting: Family Medicine
